# Patient Record
Sex: Female | Born: 1980
Health system: Southern US, Community
[De-identification: ages and names within clinical notes are randomized; demographics above are authoritative.]

## PROBLEM LIST (undated history)

## (undated) DIAGNOSIS — H539 Unspecified visual disturbance: Secondary | ICD-10-CM

## (undated) DIAGNOSIS — E282 Polycystic ovarian syndrome: Secondary | ICD-10-CM

## (undated) DIAGNOSIS — L709 Acne, unspecified: Secondary | ICD-10-CM

## (undated) DIAGNOSIS — R87619 Unspecified abnormal cytological findings in specimens from cervix uteri: Secondary | ICD-10-CM

## (undated) HISTORY — DX: Polycystic ovarian syndrome: E28.2

## (undated) HISTORY — DX: Unspecified visual disturbance: H53.9

## (undated) HISTORY — DX: Acne, unspecified: L70.9

## (undated) HISTORY — DX: Unspecified abnormal cytological findings in specimens from cervix uteri: R87.619

---

## 1998-02-17 HISTORY — PX: BREAST REDUCTION SURGERY: SHX8

## 2011-04-09 DIAGNOSIS — L709 Acne, unspecified: Secondary | ICD-10-CM | POA: Insufficient documentation

## 2013-07-04 DIAGNOSIS — K5909 Other constipation: Secondary | ICD-10-CM | POA: Insufficient documentation

## 2016-02-04 DIAGNOSIS — F419 Anxiety disorder, unspecified: Secondary | ICD-10-CM | POA: Insufficient documentation

## 2016-02-04 DIAGNOSIS — K219 Gastro-esophageal reflux disease without esophagitis: Secondary | ICD-10-CM | POA: Insufficient documentation

## 2016-05-22 DIAGNOSIS — R87619 Unspecified abnormal cytological findings in specimens from cervix uteri: Secondary | ICD-10-CM

## 2016-05-22 HISTORY — DX: Unspecified abnormal cytological findings in specimens from cervix uteri: R87.619

## 2017-05-11 ENCOUNTER — Emergency Department (INDEPENDENT_AMBULATORY_CARE_PROVIDER_SITE_OTHER)
Admission: EM | Admit: 2017-05-11 | Discharge: 2017-05-11 | Disposition: A | Discharge status: Home / Self Care | Payer: Managed Care, Other (non HMO) | Source: Home / Self Care | Attending: Emergency Medicine | Admitting: Emergency Medicine

## 2017-05-11 ENCOUNTER — Encounter: Payer: Self-pay | Admitting: Emergency Medicine

## 2017-05-11 DIAGNOSIS — R42 Dizziness and giddiness: Secondary | ICD-10-CM

## 2017-05-11 DIAGNOSIS — G44209 Tension-type headache, unspecified, not intractable: Secondary | ICD-10-CM | POA: Diagnosis not present

## 2017-05-11 MED ORDER — IBUPROFEN 200 MG PO TABS: ORAL_TABLET | ORAL | 0 refills | Status: DC

## 2017-05-11 NOTE — ED Provider Notes (Signed)
[facility]    CSN: [id number] Arrival date & time: 05/11/17  1920  [facility] 7:45 PM   History   Chief Complaint Chief Complaint  Patient presents with  . Dizziness    HPI Anita Thompson is a 37 y.o. female.   HPI Pt c/o dizziness and lightheadedness x1 year but states today it worsened. Vague sense of lightheadedness without vertigo, with mild tension/tightness of temporal muscles, associated with stressful feeling, intermittently for the past year.  She had returned home after a 12-hour day at work and was changing her clothes to exercise clothes, with the intention of exercising.  While at rest, the lightheadedness with mild temporal muscle tightness recurred, but was moderate in intensity without radiation.  She had not started exercising.  The above symptoms were nonexertional. When she experienced the above symptoms, she states she felt more anxious so she came right here to urgent care for evaluation. Associated symptoms: No focal weakness or numbness or syncope.  No chest pain or shortness of breath.  No nausea or vomiting or abdominal pain or diarrhea.  Denies GYN or GU symptoms. She admits that she is been under a lot of stress recently with a new job commuting from [place] to [place] every day.    History reviewed. No pertinent past medical history. I reviewed some past medical history on "care everywhere", including visit to GI 2018, with workup, and diagnosis gastroesophageal reflux and globus pharyngeus and chronic constipation.  Was placed on on Protonix 40 mg daily in 2018, and she reports the GE reflux has since resolved.  Still occasionally gets a globus sensation.  Has seen her GYN 2018 for abnormal uterine bleeding with negative workup.  Negative endometrial biopsy. There are no active problems to display for this patient.   History reviewed. No pertinent surgical history. She had breast reduction in  2001 OB History   None    LMP 04/16/2017, she denies chance of pregnancy  Home Medications    Prior to Admission medications   Medication Sig Start Date End Date Taking? Authorizing Provider  ibuprofen (ADVIL,MOTRIN) 200 MG tablet Take three tablets ( 600 milligrams total) every 6 with food as needed for pain. 05/11/17   Lajean ManesMassey, David, MD    Family History History reviewed. No pertinent family history. Positive for hypertension, prediabetes, stroke -mother Social History Social History   Tobacco Use  . Smoking status: Never Smoker  . Smokeless tobacco: Never Used  Substance Use Topics  . Alcohol use: Not on file  . Drug use: Not on file   Does not smoke or drink  Allergies   Patient has no allergy information on record. Per patient, no known drug allergies  Review of Systems Review of Systems  Constitutional: Positive for fatigue. Negative for chills, diaphoresis and fever.  HENT: Negative for congestion, facial swelling, rhinorrhea, sore throat, tinnitus and voice change.   Eyes: Negative for visual disturbance.  Respiratory: Negative for cough, chest tightness, shortness of breath, wheezing and stridor.   Cardiovascular: Negative for chest pain, palpitations and leg swelling.  Gastrointestinal: Negative for abdominal distention, abdominal pain, diarrhea, nausea and vomiting.  Genitourinary: Negative for difficulty urinating, dysuria, hematuria, pelvic pain and vaginal bleeding.  Skin: Negative for rash.  Neurological: Positive for dizziness and light-headedness. Negative for tremors, seizures, syncope, speech difficulty and numbness.  Hematological: Negative for adenopathy.  Psychiatric/Behavioral: Negative for confusion, dysphoric mood, hallucinations and suicidal ideas. The patient is nervous/anxious (Mild).   All  other systems reviewed and are negative.  See also history of present illness  Physical Exam Triage Vital Signs ED Triage Vitals  Enc Vitals Group      BP 05/11/17 1949 127/84     Pulse Rate 05/11/17 1949 66     Resp --      Temp 05/11/17 1949 98.6 F (37 C)     Temp Source 05/11/17 1949 Oral     SpO2 05/11/17 1949 100 %     Weight 05/11/17 1951 259 lb (117.5 kg)     Height --      Head Circumference --      Peak Flow --      Pain Score 05/11/17 1950 0     Pain Loc --      Pain Edu? --      Excl. in GC? --    No data found.  Updated Vital Signs BP 127/84 (BP Location: Right Arm)   Pulse 66   Temp 98.6 F (37 C) (Oral)   Wt 259 lb (117.5 kg)   LMP 04/15/2017   SpO2 100%   Visual Acuity Right Eye Distance:   Left Eye Distance:   Bilateral Distance:    Right Eye Near:   Left Eye Near:    Bilateral Near:     Physical Exam  Constitutional: She is oriented to person, place, and time. She appears well-developed and well-nourished. No distress.  Pleasant female, mildly anxious.  No acute distress  HENT:  Head: Normocephalic and atraumatic. Head is without raccoon's eyes and without Battle's sign.  Nose: Nose normal.  Mouth/Throat: Oropharynx is clear and moist.  Head: Mild by temporalis muscle tenderness. No bruits.  No other cranial tenderness or deformity.    Eyes: Pupils are equal, round, and reactive to light. Conjunctivae and EOM are normal. No scleral icterus.  Near vision, visual acuity grossly intact after testing each eye separately.  Neck: Normal range of motion. Neck supple. Normal carotid pulses and no JVD present. Carotid bruit is not present. No neck rigidity. No Brudzinski's sign and no Kernig's sign noted.  Cardiovascular: Normal rate, regular rhythm and normal heart sounds. Exam reveals no gallop and no friction rub.  No murmur heard. Pulmonary/Chest: Effort normal and breath sounds normal. No stridor. No respiratory distress. She has no wheezes. She has no rales.  Abdominal: Soft. She exhibits no distension. There is no tenderness.  Musculoskeletal: She exhibits no edema or tenderness.   Lymphadenopathy:    She has no cervical adenopathy.  Neurological: She is alert and oriented to person, place, and time. She displays normal reflexes. No cranial nerve deficit or sensory deficit. She exhibits normal muscle tone. Coordination normal.  Romberg negative  Skin: Skin is warm and dry. Capillary refill takes less than 2 seconds. No rash noted.  Psychiatric: She has a normal mood and affect. Her behavior is normal.  Mildly anxious  Vitals reviewed.    UC Treatments / Results  Labs (all labs ordered are listed, but only abnormal results are displayed) Labs Reviewed - No data to display  EKG None Radiology No results found.  Procedures Procedures (including critical care time)  Medications Ordered in UC Medications - No data to display   Initial Impression / Assessment and Plan / UC Course  I have reviewed the triage vital signs and the nursing notes.  Pertinent labs & imaging results that were available during my care of the patient were reviewed by me and considered in my medical  decision making (see chart for details).     Clinically, no evidence for acute neurologic or cardiorespiratory cause for her lightheadedness. Likely has muscle contraction headache and feeling of lightheadedness.  May be related to stress, new job, commuting to [place], 12-hour workday. We discussed quite at length.  Some counseling today. Over 40 minutes spent, greater than 50% of the time spent for counseling and coordination of care. Discussed the option of doing various tests, but she declined any testing today after discussion.  After discussion, she stated that she does not feel anxious now, and her bitemporal headache is significantly improved and she does not feel lightheaded now.  Advised that she may take ibuprofen up to 600 mg every 8 hours with food as needed headache.  Follow-up with your primary care doctor in 5-7 days if not improving, or emergency room/911 if symptoms  become worse, severe, or new symptoms. Precautions discussed. Red flags discussed. Questions invited and answered. Patient voiced understanding and agreement.  Final Clinical Impressions(s) / UC Diagnoses   Final diagnoses:  Lightheadedness  Acute non intractable tension-type headache    ED Discharge Orders        Ordered    ibuprofen (ADVIL,MOTRIN) 200 MG tablet     05/11/17 2051       Controlled Substance Prescriptions [place] Controlled Substance Registry consulted? Not Applicable   Lajean Manes, MD 05/13/17 [phone]

## 2017-05-11 NOTE — ED Triage Notes (Signed)
Pt c/o dizziness and lightheadedness x1 year but states today it worsened.

## 2017-10-13 ENCOUNTER — Encounter: Payer: Self-pay | Admitting: Osteopathic Medicine

## 2017-10-13 ENCOUNTER — Ambulatory Visit (INDEPENDENT_AMBULATORY_CARE_PROVIDER_SITE_OTHER): Payer: Managed Care, Other (non HMO) | Admitting: Osteopathic Medicine

## 2017-10-13 VITALS — BP 104/65 | HR 72 | Temp 98.0°F | Ht 68.0 in | Wt 246.7 lb

## 2017-10-13 DIAGNOSIS — Z833 Family history of diabetes mellitus: Secondary | ICD-10-CM | POA: Insufficient documentation

## 2017-10-13 DIAGNOSIS — Z823 Family history of stroke: Secondary | ICD-10-CM | POA: Diagnosis not present

## 2017-10-13 DIAGNOSIS — R42 Dizziness and giddiness: Secondary | ICD-10-CM

## 2017-10-13 DIAGNOSIS — Z87898 Personal history of other specified conditions: Secondary | ICD-10-CM | POA: Diagnosis not present

## 2017-10-13 DIAGNOSIS — Z8742 Personal history of other diseases of the female genital tract: Secondary | ICD-10-CM | POA: Insufficient documentation

## 2017-10-13 HISTORY — DX: Family history of diabetes mellitus: Z83.3

## 2017-10-13 HISTORY — DX: Family history of stroke: Z82.3

## 2017-10-13 MED ORDER — TRETINOIN MICROSPHERE 0.1 % EX GEL: CUTANEOUS | 2 refills | Status: DC

## 2017-10-13 NOTE — Progress Notes (Signed)
HPI: Anita Thompson is a 37 y.o. female who  has no past medical history on file.  she presents to [facility] today, 10/13/17,  for chief complaint of: New to establish care Requests routine annual physical   Reports history "dizziness" on and off >1 year, but not really true dizzzy/vertigo - difficult to describe. Urgent care visit 04/2017 notes reviewed,. Feels like "somehting in my brain is falling off a shelf." Happens maybe once per week, lasts a few moments then resolves. No lightheadedness or spinning sensation. Hx vision problems in L eye. No headaches. No discernable triggers.   Reports history back pain, not too bothersome right now.   Reports history PCOS, no OCP at this time.   Acne: on Tretinoin gel, not sexually active, requests refill   Attention problems: focusing difficulty after a couple hours at work     Past medical, surgical, social and family history reviewed:  Patient Active Problem List   Diagnosis Date Noted  . History of abnormal cervical Pap smear 10/13/2017  . Family history of diabetes mellitus in mother 10/13/2017  . Family history of stroke or transient ischemic attack in mother 10/13/2017  . Abnormal Pap smear of cervix 05/22/2016  . Anxiety 02/04/2016  . Gastroesophageal reflux disease without esophagitis 02/04/2016  . Chronic constipation 07/04/2013  . Acne 04/09/2011   Past Surgical History:  Procedure Laterality Date  . BREAST REDUCTION SURGERY  2000   Social History   Tobacco Use  . Smoking status: Never Smoker  . Smokeless tobacco: Never Used  Substance Use Topics  . Alcohol use: Not Currently   Family History  Problem Relation Age of Onset  . High blood pressure Mother   . Diabetes Mother   . Stroke Mother   . High Cholesterol Mother      Current medication list and allergy/intolerance information reviewed:    Current Outpatient Medications  Medication Sig Dispense Refill  . tretinoin  microspheres (RETIN-A MICRO) 0.1 % gel APPLY PEA SIZED AMOUNT TO THE ENTIRE FACE EACH NIGHT AT BEDTIME    . ibuprofen (ADVIL,MOTRIN) 200 MG tablet Take three tablets ( 600 milligrams total) every 6 with food as needed for pain. (Patient not taking: Reported on 10/13/2017) 30 tablet 0   No current facility-administered medications for this visit.     No Known Allergies    Review of Systems:  Constitutional:  No  fever, no chills, No recent illness, No unintentional weight changes. No significant fatigue.   HEENT: No  headache, no vision change, no hearing change, No sore throat, No  sinus pressure  Cardiac: No  chest pain, No  pressure, No palpitations, No  Orthopnea  Respiratory:  No  shortness of breath. No  Cough  Gastrointestinal: No  abdominal pain, No  nausea, No  vomiting,  No  blood in stool, No  diarrhea, No  constipation   Musculoskeletal: No new myalgia/arthralgia  Skin: No  Rash, No other wounds/concerning lesions  Genitourinary: No  incontinence, No  abnormal genital bleeding, No abnormal genital discharge  Hem/Onc: No  easy bruising/bleeding, No  abnormal lymph node  Endocrine: No cold intolerance,  No heat intolerance. No polyuria/polydipsia/polyphagia   Neurologic: No  weakness, +dizziness, No  slurred speech/focal weakness/facial droop  Psychiatric: No  concerns with depression, No  concerns with anxiety, No sleep problems, No mood problems  Exam:  BP 104/65 (BP Location: Left Arm, Patient Position: Sitting, Cuff Size: Large)   Pulse 72   Temp  98 F (36.7 C) (Oral)   Wt 246 lb 11.2 oz (111.9 kg)   LMP 10/01/2017   Constitutional: VS see above. General Appearance: alert, well-developed, well-nourished, NAD  Eyes: Normal lids and conjunctive, non-icteric sclera  Ears, Nose, Mouth, Throat: MMM, Normal external inspection ears/nares/mouth/lips/gums.   Neck: No masses, trachea midline. No thyroid enlargement. No tenderness/mass appreciated. No  lymphadenopathy  Respiratory: Normal respiratory effort. no wheeze, no rhonchi, no rales  Cardiovascular: S1/S2 normal, no murmur, no rub/gallop auscultated. RRR. No lower extremity edema.   Gastrointestinal: Nontender, no masses. No hepatomegaly, no splenomegaly. No hernia appreciated. Bowel sounds normal. Rectal exam deferred.   Musculoskeletal: Gait normal. No clubbing/cyanosis of digits.   Neurological: Normal balance/coordination. No tremor. No cranial nerve deficit on limited exam. Motor and sensation intact and symmetric all extremities and face. Cerebellar reflexes intact. PERRL, EOMI, no nystagmus, dix-hallpike negative bilaterally  Skin: warm, dry, intact. No rash/ulcer. No concerning nevi or subq nodules on limited exam.    Psychiatric: Normal judgment/insight. Normal mood and affect. Oriented x3.      ASSESSMENT/PLAN: The primary encounter diagnosis was Episode of dizziness. Diagnoses of History of abnormal cervical Pap smear, Family history of diabetes mellitus in mother, and Family history of stroke or transient ischemic attack in mother were also pertinent to this visit.  Orders Placed This Encounter  Procedures  . CBC  . COMPLETE METABOLIC PANEL WITH GFR  . Lipid panel  . TSH  . VITAMIN D 25 Hydroxy (Vit-D Deficiency, Fractures)  . Ambulatory referral to Neurology    Referral Priority:   Routine    Referral Type:   Consultation    Referral Reason:   Specialty Services Required    Requested Specialty:   Neurology    Number of Visits Requested:   1   Meds ordered this encounter  Medications  . tretinoin microspheres (RETIN-A MICRO) 0.1 % gel    Sig: APPLY PEA SIZED AMOUNT TO THE ENTIRE FACE EACH NIGHT AT BEDTIME    Dispense:  45 g    Refill:  2      Patient Instructions  Plan:   Will get lab work   Will refer neurology to discuss these episodes  They may recommend MRI or other testing    Can consider non-stimulant medication (Wellbutrin) to help  with attention   Refill Tretinoin      Visit summary with medication list and pertinent instructions was printed for patient to review. All questions at time of visit were answered - patient instructed to contact office with any additional concerns. ER/RTC precautions were reviewed with the patient.   Follow-up plan: Return for annual checkup next few months, otherwise as needed / based on results of labs and neurology advice .    Please note: voice recognition software was used to produce this document, and typos may escape review. Please contact Dr. Lyn Hollingshead for any needed clarifications.

## 2017-10-13 NOTE — Patient Instructions (Addendum)
Plan:   Will get lab work   Will refer neurology to discuss these episodes  They may recommend MRI or other testing    Can consider non-stimulant medication (Wellbutrin) to help with attention   Refill Tretinoin

## 2017-10-14 LAB — CBC
HCT: 36.8 % (ref 35.0–45.0)
Hemoglobin: 11.8 g/dL (ref 11.7–15.5)
MCH: 25 pg — AB (ref 27.0–33.0)
MCHC: 32.1 g/dL (ref 32.0–36.0)
MCV: 78 fL — ABNORMAL LOW (ref 80.0–100.0)
MPV: 10.5 fL (ref 7.5–12.5)
PLATELETS: 254 10*3/uL (ref 140–400)
RBC: 4.72 10*6/uL (ref 3.80–5.10)
RDW: 15.7 % — ABNORMAL HIGH (ref 11.0–15.0)
WBC: 5.5 10*3/uL (ref 3.8–10.8)

## 2017-10-14 LAB — LIPID PANEL
CHOLESTEROL: 164 mg/dL (ref <200)
HDL: 49 mg/dL — ABNORMAL LOW (ref >50)
LDL CHOLESTEROL (CALC): 101 mg/dL — AB
Non-HDL Cholesterol (Calc): 115 mg/dL (calc) (ref <130)
TRIGLYCERIDES: 57 mg/dL (ref <150)
Total CHOL/HDL Ratio: 3.3 (calc) (ref <5.0)

## 2017-10-14 LAB — COMPLETE METABOLIC PANEL WITH GFR
AG Ratio: 1.4 (calc) (ref 1.0–2.5)
ALT: 12 U/L (ref 6–29)
AST: 20 U/L (ref 10–30)
Albumin: 4.5 g/dL (ref 3.6–5.1)
Alkaline phosphatase (APISO): 48 U/L (ref 33–115)
BUN: 15 mg/dL (ref 7–25)
CO2: 25 mmol/L (ref 20–32)
Calcium: 10 mg/dL (ref 8.6–10.2)
Chloride: 106 mmol/L (ref 98–110)
Creat: 0.9 mg/dL (ref 0.50–1.10)
GFR, EST AFRICAN AMERICAN: 94 mL/min/{1.73_m2} (ref >=60)
GFR, Est Non African American: 81 mL/min/{1.73_m2} (ref >=60)
GLUCOSE: 98 mg/dL (ref 65–99)
Globulin: 3.3 g/dL (calc) (ref 1.9–3.7)
Potassium: 4 mmol/L (ref 3.5–5.3)
Sodium: 140 mmol/L (ref 135–146)
TOTAL PROTEIN: 7.8 g/dL (ref 6.1–8.1)
Total Bilirubin: 0.3 mg/dL (ref 0.2–1.2)

## 2017-10-14 LAB — TSH: TSH: 0.59 mIU/L

## 2017-10-14 LAB — VITAMIN D 25 HYDROXY (VIT D DEFICIENCY, FRACTURES): Vit D, 25-Hydroxy: 13 ng/mL — ABNORMAL LOW (ref 30–100)

## 2017-10-16 ENCOUNTER — Other Ambulatory Visit: Payer: Self-pay | Admitting: Osteopathic Medicine

## 2017-10-16 DIAGNOSIS — Z833 Family history of diabetes mellitus: Secondary | ICD-10-CM

## 2017-10-16 DIAGNOSIS — R42 Dizziness and giddiness: Secondary | ICD-10-CM

## 2017-10-16 DIAGNOSIS — Z823 Family history of stroke: Secondary | ICD-10-CM

## 2017-10-16 DIAGNOSIS — Z8742 Personal history of other diseases of the female genital tract: Secondary | ICD-10-CM

## 2017-10-16 MED ORDER — VITAMIN D (ERGOCALCIFEROL) 1.25 MG (50000 UNIT) PO CAPS: 50000.0000 [IU] | ORAL_CAPSULE | ORAL | 0 refills | Status: DC

## 2017-10-16 NOTE — Telephone Encounter (Signed)
Note from pharmacy:  "Product Backordered/Unavailable:MICRO GEL ON BACK ORDER, PLEASE SEND ALTERNATIVE. THANK YOU."   Please advise

## 2017-10-16 NOTE — Telephone Encounter (Signed)
Patient specifically requested the gel, would call and let her know that it is on back order at her preferred pharmacy.  We can send it elsewhere if she would like.  (Would like to discuss with her though before I send alternative since she specifically asked for the gel.)

## 2017-10-16 NOTE — Addendum Note (Signed)
Addended by: Deirdre PippinsALEXANDER, Rewa Weissberg M on: 10/16/2017 02:26 PM   Modules accepted: Orders

## 2017-10-16 NOTE — Telephone Encounter (Signed)
Left a detailed vm msg for pt regarding note from pharmacy/provider. Call back information provided.

## 2017-10-23 NOTE — Telephone Encounter (Signed)
As per pharmacy - tretinoin gel on backorder. Pt requesting a rx for regular retin-a. Thanks.

## 2017-10-26 NOTE — Telephone Encounter (Signed)
Ok meds sent.

## 2017-10-26 NOTE — Telephone Encounter (Signed)
Left vm msg for pt re: replacement med sent into pharmacy. Call back information provided.

## 2018-01-05 ENCOUNTER — Other Ambulatory Visit: Payer: Self-pay | Admitting: Osteopathic Medicine

## 2018-01-07 ENCOUNTER — Encounter: Payer: Self-pay | Admitting: Osteopathic Medicine

## 2018-01-07 NOTE — Telephone Encounter (Signed)
Pt advised and scheduled for next week.  

## 2018-01-07 NOTE — Telephone Encounter (Signed)
Needs appointment Can do basic vision screening and eye exam here to rule out serious problem I could not really attest to her eye issues without doing a personal exam

## 2018-01-07 NOTE — Telephone Encounter (Signed)
Called Pt per request. She is requesting a letter from her PCP  Pt is scheduled with [facility] in Feb for evaluation. She is having issues with night driving, especially the lights associated. She is also having difficulty looking at the computer monitor at work for long periods. Employer states if PCP writes letter, she will be able to work from home until she can get in with the specialist.   Work letter attention: Towanda Octaveony Martin, [employer].   Will route to PCP for review. Pt able to come in for OV if needed.

## 2018-01-12 ENCOUNTER — Ambulatory Visit (INDEPENDENT_AMBULATORY_CARE_PROVIDER_SITE_OTHER): Payer: Managed Care, Other (non HMO) | Admitting: Osteopathic Medicine

## 2018-01-12 ENCOUNTER — Encounter: Payer: Self-pay | Admitting: Osteopathic Medicine

## 2018-01-12 VITALS — BP 112/66 | HR 82 | Temp 97.9°F | Wt 246.2 lb

## 2018-01-12 DIAGNOSIS — H539 Unspecified visual disturbance: Secondary | ICD-10-CM

## 2018-01-12 HISTORY — DX: Unspecified visual disturbance: H53.9

## 2018-01-12 NOTE — Progress Notes (Signed)
HPI: Anita Thompson is a 37 y.o. female who  has a past medical history of Abnormal Pap smear of cervix (05/22/2016), Acne, PCOS (polycystic ovarian syndrome), and Vision abnormalities (01/12/2018).  she presents to [facility] today, 01/12/18,  for chief complaint of:  Vision/eye issue   . Location: L eye . Quality: issues with night driving and looking at computer screen for prolonged periods of time - causes eye strain and headache . Severity: . Duration: worse since she's been having to commute more and drive at night  . Context: L eye has history of vision problem, pt not sure exact dx but has been a problem since childhood, she at one point was wearing a ptach over the R eye to compensate but the L eye vision was so bad she was falling and running into things so this therapy was stopped.  . Modifying factors: working form home helps - avoids driving, she can take frequent breaks from screen time  . Assoc signs/symptoms: occasional headache after driving at night, looking at screens too long.    OD: 20/25 OS: 20/200 (yes, two hundred) OU: 20/25   At today's visit... Past medical history, surgical history, and family history reviewed and updated as needed.  Current medication list and allergy/intolerance information reviewed and updated as needed. (See remainder of HPI, ROS, Phys Exam below)           ASSESSMENT/PLAN: The encounter diagnosis was Vision abnormalities.   Limited funduscopic exam but chronic issue exacerbated by recent job changes seems more the issue than worsening of condition itself  Keep appt w/ [facility]!  See letters printed for patient - have employer contact us if additional info needed       Follow-up plan: Return if symptoms worsen or change prior to appointment with eye doctor  .                             ############################################ ############################################ ############################################ ############################################    Current Meds  Medication Sig  . ibuprofen (ADVIL,MOTRIN) 200 MG tablet Take three tablets ( 600 milligrams total) every 6 with food as needed for pain.  Marland Kitchen tretinoin (RETIN-A) 0.1 % cream Apply topically at bedtime.  . Vitamin D, Ergocalciferol, (DRISDOL) 50000 units CAPS capsule Take 1 capsule (50,000 Units total) by mouth every 7 (seven) days. Take for 12 total doses(weeks)    No Known Allergies     Review of Systems:  Constitutional: No recent illness  HEENT: see HPI  Cardiac: No  chest pain, No  pressure, No palpitations  Respiratory:  No  shortness of breath. No  Cough  Gastrointestinal: No  abdominal pain, no change on bowel habits  Musculoskeletal: No new myalgia/arthralgia  Neurologic: No  weakness, No  Dizziness  Psychiatric: No  concerns with depression, No  concerns with anxiety  Exam:  BP 112/66 (BP Location: Left Arm, Patient Position: Sitting, Cuff Size: Normal)   Pulse 82   Temp 97.9 F (36.6 C) (Oral)   Wt 246 lb 3.2 oz (111.7 kg)   BMI 37.43 kg/m   Constitutional: VS see above. General Appearance: alert, well-developed, well-nourished, NAD  Eyes: Normal lids and conjunctive, non-icteric sclera, EOMI, PERRL. Limited funduscopic exam R eye appears normal, L eye appears darker ?catraract, some vasculature isible at fundus, pt was having a difficult time w/ light and pupil constriction limited exam   Ears, Nose, Mouth, Throat: MMM, Normal external  inspection ears/nares/mouth/lips/gums.  Neck: No masses, trachea midline.   Respiratory: Normal respiratory effort.   Musculoskeletal: Gait normal. Symmetric and independent movement of all extremities  Neurological: Normal balance/coordination. No tremor.  Skin:  warm, dry, intact.   Psychiatric: Normal judgment/insight. Normal mood and affect. Oriented x3.       Visit summary with medication list and pertinent instructions was printed for patient to review, patient was advised to alert us if any updates are needed. All questions at time of visit were answered - patient instructed to contact office with any additional concerns. ER/RTC precautions were reviewed with the patient and understanding verbalized.   Note: Total time spent 25 minutes, greater than 50% of the visit was spent face-to-face counseling and coordinating care for the following: The encounter diagnosis was Vision abnormalities..  Please note: voice recognition software was used to produce this document, and typos may escape review. Please contact Dr. Lyn HollingsheadAlexander for any needed clarifications.    Follow up plan: Return if symptoms worsen or change prior to appointment with eye doctor .

## 2018-03-10 ENCOUNTER — Telehealth: Payer: Self-pay

## 2018-03-10 DIAGNOSIS — R42 Dizziness and giddiness: Secondary | ICD-10-CM

## 2018-03-10 NOTE — Telephone Encounter (Signed)
Pt called requesting a new referral for neurologist. As per pt, she did not f/u as required when last referral was initiated. When she called [facility] to make an appt, she was informed that referral had expired. Pls advise, thanks.

## 2018-03-11 NOTE — Telephone Encounter (Signed)
Referral sent 

## 2018-03-12 ENCOUNTER — Encounter: Payer: Self-pay | Admitting: Neurology

## 2018-03-25 DIAGNOSIS — H5212 Myopia, left eye: Secondary | ICD-10-CM | POA: Diagnosis not present

## 2018-03-25 DIAGNOSIS — H5201 Hypermetropia, right eye: Secondary | ICD-10-CM | POA: Diagnosis not present

## 2018-03-25 DIAGNOSIS — H53022 Refractive amblyopia, left eye: Secondary | ICD-10-CM | POA: Diagnosis not present

## 2018-03-25 DIAGNOSIS — H52222 Regular astigmatism, left eye: Secondary | ICD-10-CM | POA: Diagnosis not present

## 2018-05-04 ENCOUNTER — Ambulatory Visit: Payer: Managed Care, Other (non HMO) | Admitting: Neurology

## 2018-05-18 DIAGNOSIS — F458 Other somatoform disorders: Secondary | ICD-10-CM | POA: Diagnosis not present

## 2018-05-18 DIAGNOSIS — R1310 Dysphagia, unspecified: Secondary | ICD-10-CM | POA: Diagnosis not present

## 2018-05-18 DIAGNOSIS — R079 Chest pain, unspecified: Secondary | ICD-10-CM | POA: Diagnosis not present

## 2018-05-18 DIAGNOSIS — R0789 Other chest pain: Secondary | ICD-10-CM | POA: Diagnosis not present

## 2018-05-18 DIAGNOSIS — Z79899 Other long term (current) drug therapy: Secondary | ICD-10-CM | POA: Diagnosis not present

## 2018-05-18 DIAGNOSIS — K219 Gastro-esophageal reflux disease without esophagitis: Secondary | ICD-10-CM | POA: Diagnosis not present

## 2018-06-15 ENCOUNTER — Encounter: Payer: Self-pay | Admitting: Neurology

## 2018-06-24 ENCOUNTER — Ambulatory Visit: Payer: Self-pay | Admitting: Neurology

## 2018-09-13 ENCOUNTER — Ambulatory Visit: Payer: Self-pay | Admitting: Neurology

## 2018-12-20 ENCOUNTER — Encounter: Payer: Self-pay | Admitting: Osteopathic Medicine

## 2018-12-22 DIAGNOSIS — Z6839 Body mass index (BMI) 39.0-39.9, adult: Secondary | ICD-10-CM | POA: Diagnosis not present

## 2018-12-22 DIAGNOSIS — Z1231 Encounter for screening mammogram for malignant neoplasm of breast: Secondary | ICD-10-CM | POA: Diagnosis not present

## 2018-12-22 DIAGNOSIS — Z1151 Encounter for screening for human papillomavirus (HPV): Secondary | ICD-10-CM | POA: Diagnosis not present

## 2018-12-22 DIAGNOSIS — Z0000 Encounter for general adult medical examination without abnormal findings: Secondary | ICD-10-CM | POA: Diagnosis not present

## 2018-12-22 DIAGNOSIS — Z01419 Encounter for gynecological examination (general) (routine) without abnormal findings: Secondary | ICD-10-CM | POA: Diagnosis not present

## 2018-12-23 DIAGNOSIS — Z1151 Encounter for screening for human papillomavirus (HPV): Secondary | ICD-10-CM | POA: Diagnosis not present

## 2018-12-23 DIAGNOSIS — Z01419 Encounter for gynecological examination (general) (routine) without abnormal findings: Secondary | ICD-10-CM | POA: Diagnosis not present

## 2019-03-08 ENCOUNTER — Ambulatory Visit (INDEPENDENT_AMBULATORY_CARE_PROVIDER_SITE_OTHER): Payer: BC Managed Care – PPO | Admitting: Osteopathic Medicine

## 2019-03-08 ENCOUNTER — Other Ambulatory Visit: Payer: Self-pay

## 2019-03-08 ENCOUNTER — Encounter: Payer: Self-pay | Admitting: Osteopathic Medicine

## 2019-03-08 VITALS — BP 114/84 | HR 76 | Temp 97.6°F | Wt 264.1 lb

## 2019-03-08 DIAGNOSIS — E559 Vitamin D deficiency, unspecified: Secondary | ICD-10-CM

## 2019-03-08 DIAGNOSIS — Z0000 Encounter for general adult medical examination without abnormal findings: Secondary | ICD-10-CM | POA: Diagnosis not present

## 2019-03-08 LAB — LIPID PANEL
Cholesterol: 172 mg/dL (ref <200)
HDL: 47 mg/dL — ABNORMAL LOW (ref >=50)
LDL Cholesterol (Calc): 110 mg/dL (calc) — ABNORMAL HIGH
Non-HDL Cholesterol (Calc): 125 mg/dL (calc) (ref <130)
Total CHOL/HDL Ratio: 3.7 (calc) (ref <5.0)
Triglycerides: 61 mg/dL (ref <150)

## 2019-03-08 LAB — COMPLETE METABOLIC PANEL WITH GFR
AG Ratio: 1.4 (calc) (ref 1.0–2.5)
ALT: 17 U/L (ref 6–29)
AST: 22 U/L (ref 10–30)
Albumin: 4.4 g/dL (ref 3.6–5.1)
Alkaline phosphatase (APISO): 49 U/L (ref 31–125)
BUN: 8 mg/dL (ref 7–25)
CO2: 26 mmol/L (ref 20–32)
Calcium: 9.6 mg/dL (ref 8.6–10.2)
Chloride: 104 mmol/L (ref 98–110)
Creat: 0.78 mg/dL (ref 0.50–1.10)
GFR, Est African American: 110 mL/min/{1.73_m2} (ref >=60)
GFR, Est Non African American: 95 mL/min/{1.73_m2} (ref >=60)
Globulin: 3.1 g/dL (calc) (ref 1.9–3.7)
Glucose, Bld: 89 mg/dL (ref 65–99)
Potassium: 3.9 mmol/L (ref 3.5–5.3)
Sodium: 139 mmol/L (ref 135–146)
Total Bilirubin: 0.5 mg/dL (ref 0.2–1.2)
Total Protein: 7.5 g/dL (ref 6.1–8.1)

## 2019-03-08 LAB — CBC
HCT: 39.8 % (ref 35.0–45.0)
Hemoglobin: 12.8 g/dL (ref 11.7–15.5)
MCH: 26 pg — ABNORMAL LOW (ref 27.0–33.0)
MCHC: 32.2 g/dL (ref 32.0–36.0)
MCV: 80.9 fL (ref 80.0–100.0)
MPV: 10.7 fL (ref 7.5–12.5)
Platelets: 197 10*3/uL (ref 140–400)
RBC: 4.92 10*6/uL (ref 3.80–5.10)
RDW: 14.7 % (ref 11.0–15.0)
WBC: 4.4 10*3/uL (ref 3.8–10.8)

## 2019-03-08 LAB — VITAMIN D 25 HYDROXY (VIT D DEFICIENCY, FRACTURES): Vit D, 25-Hydroxy: 23 ng/mL — ABNORMAL LOW (ref 30–100)

## 2019-03-08 NOTE — Progress Notes (Signed)
HPI: Anita Thompson is a 39 y.o. female who  has a past medical history of Abnormal Pap smear of cervix (05/22/2016), Acne, PCOS (polycystic ovarian syndrome), and Vision abnormalities (01/12/2018).  she presents to [facility] today, 03/08/19,  for chief complaint of: Annual physical.  Patient here for annual physical, no significant complaints today otherwise.   Past medical, surgical, social and family history reviewed:  Patient Active Problem List   Diagnosis Date Noted  . Vision abnormalities 01/12/2018  . Family history of diabetes mellitus in mother 10/13/2017  . Family history of stroke or transient ischemic attack in mother 10/13/2017  . Abnormal Pap smear of cervix 05/22/2016  . Anxiety 02/04/2016  . Gastroesophageal reflux disease without esophagitis 02/04/2016  . Chronic constipation 07/04/2013  . Acne 04/09/2011    Past Surgical History:  Procedure Laterality Date  . BREAST REDUCTION SURGERY  2000    Social History   Tobacco Use  . Smoking status: Never Smoker  . Smokeless tobacco: Never Used  Substance Use Topics  . Alcohol use: Not Currently    Family History  Problem Relation Age of Onset  . High blood pressure Mother   . Diabetes Mother   . Stroke Mother   . High Cholesterol Mother      Current medication list and allergy/intolerance information reviewed:    Current Outpatient Medications  Medication Sig Dispense Refill  . ibuprofen (ADVIL,MOTRIN) 200 MG tablet Take three tablets ( 600 milligrams total) every 6 with food as needed for pain. 30 tablet 0  . tretinoin (RETIN-A) 0.1 % cream Apply topically at bedtime. 45 g 2  . Vitamin D, Ergocalciferol, (DRISDOL) 1.25 MG (50000 UNIT) CAPS capsule Take 1 capsule (50,000 Units total) by mouth every 7 (seven) days. Take for 12 total doses(weeks) then transition to OTC 1000 to 2000 units daily 12 capsule 0   No current facility-administered medications for this  visit.    No Known Allergies      Exam:  BP 114/84 (BP Location: Left Arm, Patient Position: Sitting, Cuff Size: Large)   Pulse 76   Temp 97.6 F (36.4 C) (Oral)   Wt 264 lb 1.9 oz (119.8 kg)   BMI 40.16 kg/m   Constitutional: VS see above. General Appearance: alert, well-developed, well-nourished, NAD  Eyes: Normal lids and conjunctive, non-icteric sclera  Ears, Nose, Mouth, Throat:TM normal bilaterally  Neck: No masses, trachea midline. No thyroid enlargement. No tenderness/mass appreciated. No lymphadenopathy  Respiratory: Normal respiratory effort. no wheeze, no rhonchi, no rales  Cardiovascular: S1/S2 normal, no murmur, no rub/gallop auscultated. RRR. No lower extremity edema.   Gastrointestinal: Nontender, no masses. No hepatomegaly, no splenomegaly. No hernia appreciated. Bowel sounds normal. Rectal exam deferred.   Musculoskeletal: Gait normal. No clubbing/cyanosis of digits.   Neurological: Normal balance/coordination. No tremor.   Skin: warm, dry, intact. No rash/ulcer. No concerning nevi or subq nodules on limited exam.    Psychiatric: Normal judgment/insight. Normal mood and affect. Oriented x3.    Results for orders placed or performed in visit on 03/08/19 (from the past 72 hour(s))  CBC     Status: Abnormal   Collection Time: 03/08/19 11:09 AM  Result Value Ref Range   WBC 4.4 3.8 - 10.8 Thousand/uL   RBC 4.92 3.80 - 5.10 Million/uL   Hemoglobin 12.8 11.7 - 15.5 g/dL   HCT 78.6 76.7 - 20.9 %   MCV 80.9 80.0 - 100.0 fL   MCH 26.0 (L) 27.0 - 33.0  pg   MCHC 32.2 32.0 - 36.0 g/dL   RDW 95.6 21.3 - 08.6 %   Platelets 197 140 - 400 Thousand/uL   MPV 10.7 7.5 - 12.5 fL  COMPLETE METABOLIC PANEL WITH GFR     Status: None   Collection Time: 03/08/19 11:09 AM  Result Value Ref Range   Glucose, Bld 89 65 - 99 mg/dL    Comment: .            Fasting reference interval .    BUN 8 7 - 25 mg/dL   Creat 5.78 4.69 - 6.29 mg/dL   GFR, Est Non African  American 95 > OR = 60 mL/min/1.31m2   GFR, Est African American 110 > OR = 60 mL/min/1.59m2   BUN/Creatinine Ratio NOT APPLICABLE 6 - 22 (calc)   Sodium 139 135 - 146 mmol/L   Potassium 3.9 3.5 - 5.3 mmol/L   Chloride 104 98 - 110 mmol/L   CO2 26 20 - 32 mmol/L   Calcium 9.6 8.6 - 10.2 mg/dL   Total Protein 7.5 6.1 - 8.1 g/dL   Albumin 4.4 3.6 - 5.1 g/dL   Globulin 3.1 1.9 - 3.7 g/dL (calc)   AG Ratio 1.4 1.0 - 2.5 (calc)   Total Bilirubin 0.5 0.2 - 1.2 mg/dL   Alkaline phosphatase (APISO) 49 31 - 125 U/L   AST 22 10 - 30 U/L   ALT 17 6 - 29 U/L  Lipid panel     Status: Abnormal   Collection Time: 03/08/19 11:09 AM  Result Value Ref Range   Cholesterol 172 <200 mg/dL   HDL 47 (L) > OR = 50 mg/dL   Triglycerides 61 <528 mg/dL   LDL Cholesterol (Calc) 110 (H) mg/dL (calc)    Comment: Reference range: <100 . Desirable range <100 mg/dL for primary prevention;   <70 mg/dL for patients with CHD or diabetic patients  with > or = 2 CHD risk factors. Marland Kitchen LDL-C is now calculated using the Martin-Hopkins  calculation, which is a validated novel method providing  better accuracy than the Friedewald equation in the  estimation of LDL-C.  Horald Pollen et al. Lenox Ahr. 4132;440(10): 2061-2068  (http://education.[facility].com/faq/FAQ164)    Total CHOL/HDL Ratio 3.7 <5.0 (calc)   Non-HDL Cholesterol (Calc) 125 <130 mg/dL (calc)    Comment: For patients with diabetes plus 1 major ASCVD risk  factor, treating to a non-HDL-C goal of <100 mg/dL  (LDL-C of <27 mg/dL) is considered a therapeutic  option.   VITAMIN D 25 Hydroxy (Vit-D Deficiency, Fractures)     Status: Abnormal   Collection Time: 03/08/19 11:09 AM  Result Value Ref Range   Vit D, 25-Hydroxy 23 (L) 30 - 100 ng/mL    Comment: Vitamin D Status         25-OH Vitamin D: . Deficiency:                    <20 ng/mL Insufficiency:             20 - 29 ng/mL Optimal:                 > or = 30 ng/mL . For 25-OH Vitamin D testing on  patients on  D2-supplementation and patients for whom quantitation  of D2 and D3 fractions is required, the [facility]ureD(TM) 25-OH VIT D, (D2,D3), LC/MS/MS is recommended: order  code 25366 (patients >78yrs). See Note 1 . Note 1 . For additional information, please refer to  http://education.[facility].com/faq/FAQ199  (This link is being provided for informational/ educational purposes only.)     No results found.   ASSESSMENT/PLAN: The primary encounter diagnosis was Annual physical exam. A diagnosis of Vitamin D deficiency was also pertinent to this visit.   Orders Placed This Encounter  Procedures  . CBC  . COMPLETE METABOLIC PANEL WITH GFR  . Lipid panel  . VITAMIN D 25 Hydroxy (Vit-D Deficiency, Fractures)    No orders of the defined types were placed in this encounter.   Patient Instructions  General Preventive Care  Most recent routine screening lipids/other labs: ordered today   Everyone should have blood pressure checked once per year.   Tobacco: don't!  Alcohol: responsible moderation is ok for most adults - if you have concerns about your alcohol intake, please talk to me!   Exercise: as tolerated to reduce risk of cardiovascular disease and diabetes. Strength training will also prevent osteoporosis.   Mental health: if need for mental health care (medicines, counseling, other), or concerns about moods, please let me know!   Sexual health: if need for STD testing, or if concerns with libido/pain problems, please let me know! If you need to discuss your birth control options, please let me know!   Advanced Directive: Living Will and/or Healthcare Power of Attorney recommended for all adults, regardless of age or health.  Vaccines  Flu vaccine: recommended for almost everyone, every fall.   Shingles vaccine: Shingrix recommended after age 17.   Pneumonia vaccines: Prevnar and Pneumovax recommended after age 74.  Tetanus booster: Tdap  recommended every 10 years. Due 2027.  Cancer screenings   Colon cancer screening: recommended for everyone at age 66  Breast cancer screening: mammogram recommended at age 21 every other year at least, and annually after age 39.   Cervical cancer screening: Pap per OBGYN  Lung cancer screening: not needed for non-smokers. Infection screenings . HIV: recommended screening at least once age 5-65, more often as needed. . Gonorrhea/Chlamydia: screening as needed. . Hepatitis C: recommended for anyone born 19-1967 -not needed!  . TB: certain at-risk populations, or depending on work requirements and/or travel history Other . Bone Density Test: recommended for women at age 71.        Visit summary with medication list and pertinent instructions was printed for patient to review. All questions at time of visit were answered - patient instructed to contact office with any additional concerns or updates. ER/RTC precautions were reviewed with the patient.    Please note: voice recognition software was used to produce this document, and typos may escape review. Please contact Dr. Sheppard Coil for any needed clarifications.     Follow-up plan: Return in about 1 year (around 03/07/2020) for [place] (call week prior to visit for lab orders).

## 2019-03-08 NOTE — Patient Instructions (Addendum)
General Preventive Care  Most recent routine screening lipids/other labs: ordered today   Everyone should have blood pressure checked once per year.   Tobacco: don't!  Alcohol: responsible moderation is ok for most adults - if you have concerns about your alcohol intake, please talk to me!   Exercise: as tolerated to reduce risk of cardiovascular disease and diabetes. Strength training will also prevent osteoporosis.   Mental health: if need for mental health care (medicines, counseling, other), or concerns about moods, please let me know!   Sexual health: if need for STD testing, or if concerns with libido/pain problems, please let me know! If you need to discuss your birth control options, please let me know!   Advanced Directive: Living Will and/or Healthcare Power of Attorney recommended for all adults, regardless of age or health.  Vaccines  Flu vaccine: recommended for almost everyone, every fall.   Shingles vaccine: Shingrix recommended after age 37.   Pneumonia vaccines: Prevnar and Pneumovax recommended after age 94.  Tetanus booster: Tdap recommended every 10 years. Due 2027.  Cancer screenings   Colon cancer screening: recommended for everyone at age 15  Breast cancer screening: mammogram recommended at age 70 every other year at least, and annually after age 55.   Cervical cancer screening: Pap per OBGYN  Lung cancer screening: not needed for non-smokers. Infection screenings . HIV: recommended screening at least once age 66-65, more often as needed. . Gonorrhea/Chlamydia: screening as needed. . Hepatitis C: recommended for anyone born 71-1967 -not needed!  . TB: certain at-risk populations, or depending on work requirements and/or travel history Other . Bone Density Test: recommended for women at age 46.

## 2019-03-10 ENCOUNTER — Other Ambulatory Visit: Payer: Self-pay | Admitting: Osteopathic Medicine

## 2019-03-10 MED ORDER — VITAMIN D (ERGOCALCIFEROL) 1.25 MG (50000 UNIT) PO CAPS: 50000.0000 [IU] | ORAL_CAPSULE | ORAL | 0 refills | Status: DC

## 2019-03-14 ENCOUNTER — Encounter: Payer: Self-pay | Admitting: Osteopathic Medicine

## 2019-03-14 DIAGNOSIS — R42 Dizziness and giddiness: Secondary | ICD-10-CM

## 2019-03-24 DIAGNOSIS — E282 Polycystic ovarian syndrome: Secondary | ICD-10-CM | POA: Diagnosis not present

## 2019-03-24 DIAGNOSIS — N915 Oligomenorrhea, unspecified: Secondary | ICD-10-CM | POA: Diagnosis not present

## 2019-03-24 DIAGNOSIS — N83201 Unspecified ovarian cyst, right side: Secondary | ICD-10-CM | POA: Diagnosis not present

## 2019-03-24 DIAGNOSIS — R102 Pelvic and perineal pain: Secondary | ICD-10-CM | POA: Diagnosis not present

## 2019-05-12 DIAGNOSIS — R002 Palpitations: Secondary | ICD-10-CM | POA: Insufficient documentation

## 2019-05-12 DIAGNOSIS — R079 Chest pain, unspecified: Secondary | ICD-10-CM | POA: Diagnosis not present

## 2019-05-31 ENCOUNTER — Other Ambulatory Visit: Payer: Self-pay | Admitting: Osteopathic Medicine

## 2019-06-01 DIAGNOSIS — N83201 Unspecified ovarian cyst, right side: Secondary | ICD-10-CM | POA: Diagnosis not present

## 2019-06-01 DIAGNOSIS — E282 Polycystic ovarian syndrome: Secondary | ICD-10-CM | POA: Diagnosis not present

## 2019-06-01 DIAGNOSIS — R102 Pelvic and perineal pain: Secondary | ICD-10-CM | POA: Diagnosis not present

## 2019-06-06 DIAGNOSIS — R42 Dizziness and giddiness: Secondary | ICD-10-CM | POA: Diagnosis not present

## 2019-06-06 DIAGNOSIS — R519 Headache, unspecified: Secondary | ICD-10-CM | POA: Diagnosis not present

## 2020-03-08 ENCOUNTER — Ambulatory Visit (INDEPENDENT_AMBULATORY_CARE_PROVIDER_SITE_OTHER): Payer: PRIVATE HEALTH INSURANCE | Admitting: Osteopathic Medicine

## 2020-03-08 ENCOUNTER — Encounter: Payer: Self-pay | Admitting: Osteopathic Medicine

## 2020-03-08 ENCOUNTER — Other Ambulatory Visit: Payer: Self-pay

## 2020-03-08 ENCOUNTER — Ambulatory Visit (INDEPENDENT_AMBULATORY_CARE_PROVIDER_SITE_OTHER): Payer: PRIVATE HEALTH INSURANCE

## 2020-03-08 VITALS — BP 123/57 | HR 79 | Ht 69.49 in | Wt 257.1 lb

## 2020-03-08 DIAGNOSIS — E559 Vitamin D deficiency, unspecified: Secondary | ICD-10-CM | POA: Insufficient documentation

## 2020-03-08 DIAGNOSIS — Z0000 Encounter for general adult medical examination without abnormal findings: Secondary | ICD-10-CM | POA: Diagnosis not present

## 2020-03-08 DIAGNOSIS — M47816 Spondylosis without myelopathy or radiculopathy, lumbar region: Secondary | ICD-10-CM

## 2020-03-08 DIAGNOSIS — Z833 Family history of diabetes mellitus: Secondary | ICD-10-CM | POA: Diagnosis not present

## 2020-03-08 DIAGNOSIS — E049 Nontoxic goiter, unspecified: Secondary | ICD-10-CM

## 2020-03-08 DIAGNOSIS — K219 Gastro-esophageal reflux disease without esophagitis: Secondary | ICD-10-CM | POA: Diagnosis not present

## 2020-03-08 DIAGNOSIS — E282 Polycystic ovarian syndrome: Secondary | ICD-10-CM

## 2020-03-08 DIAGNOSIS — G8929 Other chronic pain: Secondary | ICD-10-CM | POA: Insufficient documentation

## 2020-03-08 DIAGNOSIS — M545 Low back pain, unspecified: Secondary | ICD-10-CM

## 2020-03-08 DIAGNOSIS — M67439 Ganglion, unspecified wrist: Secondary | ICD-10-CM

## 2020-03-08 LAB — HM MAMMOGRAPHY: HM Mammogram: ABNORMAL — AB (ref 0–4)

## 2020-03-08 NOTE — Progress Notes (Signed)
HPI: Anita Thompson is a 40 y.o. female who  has a past medical history of Abnormal Pap smear of cervix (05/22/2016), Acne, PCOS (polycystic ovarian syndrome), and Vision abnormalities (01/12/2018).  she presents to [facility] today, 03/08/19,  for chief complaint of: Annual physical.  Patient here for annual physical  Chronic lower back pain, bilateral but radiates a bit into the left hip area.  Would like to possibly seek injections for this, no previous MRI  Lump on right wrist consistent with ganglion cyst, patient reports it is nonpainful and does not bother her.   Past medical, surgical, social and family history reviewed:  Patient Active Problem List   Diagnosis Date Noted   Ganglion of wrist 03/08/2020   Chronic bilateral low back pain without sciatica 03/08/2020   Thyroid enlarged 03/08/2020   PCOS (polycystic ovarian syndrome) 03/08/2020   Vitamin D deficiency 03/08/2020   Palpitations 05/12/2019   Vision abnormalities 01/12/2018   Family history of diabetes mellitus in mother 10/13/2017   Family history of stroke or transient ischemic attack in mother 10/13/2017   Abnormal Pap smear of cervix 05/22/2016   Anxiety 02/04/2016   Gastroesophageal reflux disease without esophagitis 02/04/2016   Chronic constipation 07/04/2013   Acne 04/09/2011    Past Surgical History:  Procedure Laterality Date   BREAST REDUCTION SURGERY  2000    Social History   Tobacco Use   Smoking status: Never Smoker   Smokeless tobacco: Never Used  Substance Use Topics   Alcohol use: Not Currently    Family History  Problem Relation Age of Onset   High blood pressure Mother    Diabetes Mother    Stroke Mother    High Cholesterol Mother      Current medication list and allergy/intolerance information reviewed:    Current Outpatient Medications  Medication Sig Dispense Refill   cholecalciferol (VITAMIN D3) 25 MCG (1000  UNIT) tablet Take 1,000 Units by mouth daily.     tretinoin (RETIN-A) 0.1 % cream Apply topically at bedtime. 45 g 2   No current facility-administered medications for this visit.    No Known Allergies      Exam:  BP (!) 123/57 (BP Location: Left Arm, Patient Position: Sitting, Cuff Size: Large)    Pulse 79    Ht 5' 9.49" (1.765 m)    Wt 257 lb 1.6 oz (116.6 kg)    SpO2 99%    BMI 37.44 kg/m   Constitutional: VS see above. General Appearance: alert, well-developed, well-nourished, NAD  Eyes: Normal lids and conjunctive, non-icteric sclera  Ears, Nose, Mouth, Throat:TM normal bilaterally  Neck: No masses, trachea midline. No thyroid enlargement. No tenderness/mass appreciated. No lymphadenopathy  Respiratory: Normal respiratory effort. no wheeze, no rhonchi, no rales  Cardiovascular: S1/S2 normal, no murmur, no rub/gallop auscultated. RRR. No lower extremity edema.   Gastrointestinal: Nontender, no masses. No hepatomegaly, no splenomegaly. No hernia appreciated. Bowel sounds normal. Rectal exam deferred.   Musculoskeletal: Gait normal. No clubbing/cyanosis of digits.  Tenderness to paraspinal musculature lower lumbar spine around L4/L5, L5/L1.  Negative straight leg raise bilaterally.  Ganglion cyst present on right wrist   Neurological: Normal balance/coordination. No tremor.   Skin: warm, dry, intact. No rash/ulcer. No concerning nevi or subq nodules on limited exam.    Psychiatric: Normal judgment/insight. Normal mood and affect. Oriented x3.    Results for orders placed or performed in visit on 03/08/20 (from the past 72 hour(s))  CBC  Status: None   Collection Time: 03/08/20 12:00 AM  Result Value Ref Range   WBC 4.7 3.8 - 10.8 Thousand/uL   RBC 4.80 3.80 - 5.10 Million/uL   Hemoglobin 13.0 11.7 - 15.5 g/dL   HCT 16.139.5 09.635.0 - 04.545.0 %   MCV 82.3 80.0 - 100.0 fL   MCH 27.1 27.0 - 33.0 pg   MCHC 32.9 32.0 - 36.0 g/dL   RDW 40.914.5 81.111.0 - 91.415.0 %   Platelets 211 140 -  400 Thousand/uL   MPV 11.0 7.5 - 12.5 fL  COMPLETE METABOLIC PANEL WITH GFR     Status: None   Collection Time: 03/08/20 12:00 AM  Result Value Ref Range   Glucose, Bld 91 65 - 99 mg/dL    Comment: .            Fasting reference interval .    BUN 13 7 - 25 mg/dL   Creat 7.820.69 9.560.50 - 2.131.10 mg/dL   GFR, Est Non African American 108 > OR = 60 mL/min/1.2273m2   GFR, Est African American 125 > OR = 60 mL/min/1.2573m2   BUN/Creatinine Ratio NOT APPLICABLE 6 - 22 (calc)   Sodium 139 135 - 146 mmol/L   Potassium 4.0 3.5 - 5.3 mmol/L   Chloride 106 98 - 110 mmol/L   CO2 27 20 - 32 mmol/L   Calcium 9.6 8.6 - 10.2 mg/dL   Total Protein 7.6 6.1 - 8.1 g/dL   Albumin 4.5 3.6 - 5.1 g/dL   Globulin 3.1 1.9 - 3.7 g/dL (calc)   AG Ratio 1.5 1.0 - 2.5 (calc)   Total Bilirubin 0.4 0.2 - 1.2 mg/dL   Alkaline phosphatase (APISO) 46 31 - 125 U/L   AST 19 10 - 30 U/L   ALT 14 6 - 29 U/L  Lipid panel     Status: Abnormal   Collection Time: 03/08/20 12:00 AM  Result Value Ref Range   Cholesterol 179 <200 mg/dL   HDL 49 (L) > OR = 50 mg/dL   Triglycerides 56 <086<150 mg/dL   LDL Cholesterol (Calc) 115 (H) mg/dL (calc)    Comment: Reference range: <100 . Desirable range <100 mg/dL for primary prevention;   <70 mg/dL for patients with CHD or diabetic patients  with > or = 2 CHD risk factors. Marland Kitchen. LDL-C is now calculated using the Martin-Hopkins  calculation, which is a validated novel method providing  better accuracy than the Friedewald equation in the  estimation of LDL-C.  Horald PollenMartin SS et al. Lenox AhrJAMA. 5784;696(292013;310(19): 2061-2068  (http://education.[facility].com/faq/FAQ164)    Total CHOL/HDL Ratio 3.7 <5.0 (calc)   Non-HDL Cholesterol (Calc) 130 (H) <130 mg/dL (calc)    Comment: For patients with diabetes plus 1 major ASCVD risk  factor, treating to a non-HDL-C goal of <100 mg/dL  (LDL-C of <52<70 mg/dL) is considered a therapeutic  option.   Hemoglobin A1c     Status: None   Collection Time: 03/08/20  12:00 AM  Result Value Ref Range   Hgb A1c MFr Bld 5.5 <5.7 % of total Hgb    Comment: For the purpose of screening for the presence of diabetes: . <5.7%       Consistent with the absence of diabetes 5.7-6.4%    Consistent with increased risk for diabetes             (prediabetes) > or =6.5%  Consistent with diabetes . This assay result is consistent with a decreased risk of diabetes. . Currently, no consensus exists  regarding use of hemoglobin A1c for diagnosis of diabetes in children. . According to American Diabetes Association (ADA) guidelines, hemoglobin A1c <7.0% represents optimal control in non-pregnant diabetic patients. Different metrics may apply to specific patient populations.  Standards of Medical Care in Diabetes(ADA). .    Mean Plasma Glucose 111 mg/dL   eAG (mmol/L) 6.2 mmol/L  VITAMIN D 25 Hydroxy (Vit-D Deficiency, Fractures)     Status: None   Collection Time: 03/08/20 12:00 AM  Result Value Ref Range   Vit D, 25-Hydroxy 39 30 - 100 ng/mL    Comment: Vitamin D Status         25-OH Vitamin D: . Deficiency:                    <20 ng/mL Insufficiency:             20 - 29 ng/mL Optimal:                 > or = 30 ng/mL . For 25-OH Vitamin D testing on patients on  D2-supplementation and patients for whom quantitation  of D2 and D3 fractions is required, the [facility]ureD(TM) 25-OH VIT D, (D2,D3), LC/MS/MS is recommended: order  code 62694 (patients >27yrs). See Note 1 . Note 1 . For additional information, please refer to  http://education.[facility].com/faq/FAQ199  (This link is being provided for informational/ educational purposes only.)   TSH(Reflex)     Status: None   Collection Time: 03/08/20 12:00 AM  Result Value Ref Range   TSH 0.77 mIU/L    Comment:           Reference Range .           > or = 20 Years  0.40-4.50 .                Pregnancy Ranges           First trimester    0.26-2.66           Second trimester   0.55-2.73            Third trimester    0.43-2.91   REFLEX TIQ     Status: None   Collection Time: 03/08/20 12:00 AM  Result Value Ref Range   REFLEX TIQ      Comment: OUR RECORDS INDICATE THAT YOU HAVE ORDERED TSH ORDER CODE 8018.  THIS IS A REFLEX-SPECIFIC ORDER CODE. HOWEVER, ONLY THE INITIAL TEST WAS PERFORMED, BECAUSE WE DO NOT HAVE A REFLEX TESTING AUTHORIZATION FORM ON FILE FOR YOU.  TO  PERFORM A REFLEX TEST WE NEED YOU TO SIGN AN AUTHORIZATION FORM  SPECIFYING (A) THE REFLEXIVE TEST AND (B) THE RESULTS THAT WILL  TRIGGER THE PERFORMANCE OF THE REFLEX TEST.  PLEASE CONTACT A  CLIENT SERVICE REPRESENTATIVE AT [facility] IF YOU WOULD  LIKE ADDITIONAL TESTING DONE OR CONTACT YOUR SALES REPRESENTATIVE  TO OBTAIN A COPY OF THE REFLEXIVE TESTING AUTHORIZATION FORM. . Varney Biles Lumbar Spine Complete  Result Date: 03/08/2020 CLINICAL DATA:  Chronic low back pain EXAM: LUMBAR SPINE - COMPLETE 4+ VIEW COMPARISON:  None. FINDINGS: Five lumbar type vertebral segments. Vertebral body heights and alignment are maintained. No fracture identified. Mild disc height loss of L5-S1. The remaining intervertebral disc spaces are relatively preserved. Minimal degenerative endplate spurring. Mild lower lumbar facet arthrosis. IMPRESSION: Mild lower lumbar spondylosis. Electronically Signed   By: Duanne Guess D.O.   On: 03/08/2020 15:17  ASSESSMENT/PLAN: The primary encounter diagnosis was Annual physical exam. Diagnoses of Family history of diabetes mellitus in mother, Vitamin D deficiency, Gastroesophageal reflux disease without esophagitis, PCOS (polycystic ovarian syndrome), Thyroid enlarged, Chronic bilateral low back pain without sciatica, and Ganglion of wrist, unspecified laterality were also pertinent to this visit.   Orders Placed This Encounter  Procedures   DG Lumbar Spine Complete   CBC   COMPLETE METABOLIC PANEL WITH GFR   Lipid panel   Hemoglobin A1c   VITAMIN D 25 Hydroxy  (Vit-D Deficiency, Fractures)   TSH(Reflex)   REFLEX TIQ    No orders of the defined types were placed in this encounter.   Patient Instructions     General Preventive Care  Most recent routine screening lipids/other labs: ordered today   Everyone should have blood pressure checked once per year.   Tobacco: don't!  Alcohol: responsible moderation is ok for most adults - if you have concerns about your alcohol intake, please talk to me!   Exercise: as tolerated to reduce risk of cardiovascular disease and diabetes. Strength training will also prevent osteoporosis.   Mental health: if need for mental health care (medicines, counseling, other), or concerns about moods, please let me know!   Sexual health: if need for STD testing, or if concerns with libido/pain problems, please let me know! If you need to discuss your birth control options, please let me know!   Advanced Directive: Living Will and/or Healthcare Power of Attorney recommended for all adults, regardless of age or health.  Vaccines  Flu vaccine: recommended every fall/winter  Shingles vaccine: Shingrix recommended after age 62.   Pneumonia vaccines: recommended after age 62.  Tetanus booster: Tdap recommended every 10 years. Due 2027.  COVID: THANKS for getting your vaccine :) recommend booster when due (6 mos after initial vaccine series)   Cancer screenings   Colon cancer screening: recommended for everyone at age 78  Breast cancer screening: mammogram recommended at age 22 every other year at least, and annually after age 9.   Cervical cancer screening: Pap per OBGYN  Lung cancer screening: not needed for non-smokers. Infection screenings  HIV and Hepatitis C: recommended screening at least once age 42-65, more often as needed.  Gonorrhea/Chlamydia: screening as needed.  TB: certain at-risk populations, or depending on work requirements and/or travel history Other  Bone Density Test:  recommended age 76.       Visit summary with medication list and pertinent instructions was printed for patient to review. All questions at time of visit were answered - patient instructed to contact office with any additional concerns or updates. ER/RTC precautions were reviewed with the patient.    Please note: voice recognition software was used to produce this document, and typos may escape review. Please contact Dr. Lyn Hollingshead for any needed clarifications.     Follow-up plan: Return in about 1 year (around 03/08/2021) for [place] (call week prior to visit for lab orders).

## 2020-03-08 NOTE — Patient Instructions (Addendum)
  General Preventive Care  Most recent routine screening lipids/other labs: ordered today   Everyone should have blood pressure checked once per year.   Tobacco: don't!  Alcohol: responsible moderation is ok for most adults - if you have concerns about your alcohol intake, please talk to me!   Exercise: as tolerated to reduce risk of cardiovascular disease and diabetes. Strength training will also prevent osteoporosis.   Mental health: if need for mental health care (medicines, counseling, other), or concerns about moods, please let me know!   Sexual health: if need for STD testing, or if concerns with libido/pain problems, please let me know! If you need to discuss your birth control options, please let me know!   Advanced Directive: Living Will and/or Healthcare Power of Attorney recommended for all adults, regardless of age or health.  Vaccines  Flu vaccine: recommended every fall/winter  Shingles vaccine: Shingrix recommended after age 32.   Pneumonia vaccines: recommended after age 21.  Tetanus booster: Tdap recommended every 10 years. Due 2027.  COVID: THANKS for getting your vaccine :) recommend booster when due (6 mos after initial vaccine series)   Cancer screenings   Colon cancer screening: recommended for everyone at age 63  Breast cancer screening: mammogram recommended at age 40 every other year at least, and annually after age 107.   Cervical cancer screening: Pap per OBGYN  Lung cancer screening: not needed for non-smokers. Infection screenings . HIV and Hepatitis C: recommended screening at least once age 82-65, more often as needed. . Gonorrhea/Chlamydia: screening as needed. . TB: certain at-risk populations, or depending on work requirements and/or travel history Other . Bone Density Test: recommended age 65.

## 2020-03-09 LAB — CBC
HCT: 39.5 % (ref 35.0–45.0)
Hemoglobin: 13 g/dL (ref 11.7–15.5)
MCH: 27.1 pg (ref 27.0–33.0)
MCHC: 32.9 g/dL (ref 32.0–36.0)
MCV: 82.3 fL (ref 80.0–100.0)
MPV: 11 fL (ref 7.5–12.5)
Platelets: 211 10*3/uL (ref 140–400)
RBC: 4.8 10*6/uL (ref 3.80–5.10)
RDW: 14.5 % (ref 11.0–15.0)
WBC: 4.7 10*3/uL (ref 3.8–10.8)

## 2020-03-09 LAB — HEMOGLOBIN A1C
Hgb A1c MFr Bld: 5.5 % of total Hgb (ref <5.7)
Mean Plasma Glucose: 111 mg/dL
eAG (mmol/L): 6.2 mmol/L

## 2020-03-09 LAB — COMPLETE METABOLIC PANEL WITH GFR
AG Ratio: 1.5 (calc) (ref 1.0–2.5)
ALT: 14 U/L (ref 6–29)
AST: 19 U/L (ref 10–30)
Albumin: 4.5 g/dL (ref 3.6–5.1)
Alkaline phosphatase (APISO): 46 U/L (ref 31–125)
BUN: 13 mg/dL (ref 7–25)
CO2: 27 mmol/L (ref 20–32)
Calcium: 9.6 mg/dL (ref 8.6–10.2)
Chloride: 106 mmol/L (ref 98–110)
Creat: 0.69 mg/dL (ref 0.50–1.10)
GFR, Est African American: 125 mL/min/{1.73_m2} (ref >=60)
GFR, Est Non African American: 108 mL/min/{1.73_m2} (ref >=60)
Globulin: 3.1 g/dL (calc) (ref 1.9–3.7)
Glucose, Bld: 91 mg/dL (ref 65–99)
Potassium: 4 mmol/L (ref 3.5–5.3)
Sodium: 139 mmol/L (ref 135–146)
Total Bilirubin: 0.4 mg/dL (ref 0.2–1.2)
Total Protein: 7.6 g/dL (ref 6.1–8.1)

## 2020-03-09 LAB — LIPID PANEL
Cholesterol: 179 mg/dL (ref <200)
HDL: 49 mg/dL — ABNORMAL LOW (ref >=50)
LDL Cholesterol (Calc): 115 mg/dL (calc) — ABNORMAL HIGH
Non-HDL Cholesterol (Calc): 130 mg/dL (calc) — ABNORMAL HIGH (ref <130)
Total CHOL/HDL Ratio: 3.7 (calc) (ref <5.0)
Triglycerides: 56 mg/dL (ref <150)

## 2020-03-09 LAB — REFLEX TIQ

## 2020-03-09 LAB — TSH(REFL): TSH: 0.77 mIU/L

## 2020-03-09 LAB — VITAMIN D 25 HYDROXY (VIT D DEFICIENCY, FRACTURES): Vit D, 25-Hydroxy: 39 ng/mL (ref 30–100)

## 2020-05-15 DIAGNOSIS — Z6838 Body mass index (BMI) 38.0-38.9, adult: Secondary | ICD-10-CM | POA: Diagnosis not present

## 2020-05-15 DIAGNOSIS — Z124 Encounter for screening for malignant neoplasm of cervix: Secondary | ICD-10-CM | POA: Diagnosis not present

## 2020-05-15 DIAGNOSIS — Z01419 Encounter for gynecological examination (general) (routine) without abnormal findings: Secondary | ICD-10-CM | POA: Diagnosis not present

## 2020-05-22 LAB — HM PAP SMEAR: HM Pap smear: NORMAL

## 2021-03-06 DIAGNOSIS — N938 Other specified abnormal uterine and vaginal bleeding: Secondary | ICD-10-CM | POA: Diagnosis not present

## 2021-03-06 DIAGNOSIS — N915 Oligomenorrhea, unspecified: Secondary | ICD-10-CM | POA: Diagnosis not present

## 2021-03-06 DIAGNOSIS — E282 Polycystic ovarian syndrome: Secondary | ICD-10-CM | POA: Diagnosis not present

## 2021-03-11 ENCOUNTER — Ambulatory Visit (INDEPENDENT_AMBULATORY_CARE_PROVIDER_SITE_OTHER): Payer: BC Managed Care – PPO | Admitting: Medical-Surgical

## 2021-03-11 ENCOUNTER — Encounter: Payer: Self-pay | Admitting: Medical-Surgical

## 2021-03-11 ENCOUNTER — Other Ambulatory Visit: Payer: Self-pay

## 2021-03-11 ENCOUNTER — Encounter: Payer: PRIVATE HEALTH INSURANCE | Admitting: Osteopathic Medicine

## 2021-03-11 VITALS — BP 110/71 | HR 87 | Temp 97.9°F | Ht 67.5 in | Wt 239.2 lb

## 2021-03-11 DIAGNOSIS — Z0000 Encounter for general adult medical examination without abnormal findings: Secondary | ICD-10-CM | POA: Diagnosis not present

## 2021-03-11 DIAGNOSIS — E049 Nontoxic goiter, unspecified: Secondary | ICD-10-CM | POA: Diagnosis not present

## 2021-03-11 DIAGNOSIS — Z833 Family history of diabetes mellitus: Secondary | ICD-10-CM | POA: Diagnosis not present

## 2021-03-11 DIAGNOSIS — Z1159 Encounter for screening for other viral diseases: Secondary | ICD-10-CM

## 2021-03-11 DIAGNOSIS — Z114 Encounter for screening for human immunodeficiency virus [HIV]: Secondary | ICD-10-CM

## 2021-03-11 DIAGNOSIS — E282 Polycystic ovarian syndrome: Secondary | ICD-10-CM | POA: Diagnosis not present

## 2021-03-11 DIAGNOSIS — E559 Vitamin D deficiency, unspecified: Secondary | ICD-10-CM

## 2021-03-11 MED ORDER — OZEMPIC (0.25 OR 0.5 MG/DOSE) 2 MG/1.5ML ~~LOC~~ SOPN: 0.5000 mg | PEN_INJECTOR | SUBCUTANEOUS | 0 refills | Status: DC

## 2021-03-11 NOTE — Progress Notes (Signed)
HPI: Anita Thompson is a 41 y.o. female who  has a past medical history of Abnormal Pap smear of cervix (05/22/2016), Acne, PCOS (polycystic ovarian syndrome), and Vision abnormalities (01/12/2018).  she presents to [facility] today, 03/11/21,  for chief complaint of: Annual physical exam  Dentist: Overdue Eye exam: 2020, left eye vision impairment since childhood Exercise: Approximately 4 times weekly does weights and cardio Diet: No dietary restrictions, working on making changes for weight loss Pap smear: OB/GYN is [place] [facility], records requested Mammogram: Completed last year, upcoming appointment COVID vaccine: Done, no booster  Concerns: Weight gain-having a lot of difficulty losing weight despite dietary modifications and exercise.  Is interested and checking her hormones as well as possibly starting Ozempic/Wegovy.  Past medical, surgical, social and family history reviewed:  Patient Active Problem List   Diagnosis Date Noted   Ganglion of wrist 03/08/2020   Chronic bilateral low back pain without sciatica 03/08/2020   Thyroid enlarged 03/08/2020   PCOS (polycystic ovarian syndrome) 03/08/2020   Vitamin D deficiency 03/08/2020   Palpitations 05/12/2019   Vision abnormalities 01/12/2018   Family history of diabetes mellitus in mother 10/13/2017   Family history of stroke or transient ischemic attack in mother 10/13/2017   Abnormal Pap smear of cervix 05/22/2016   Anxiety 02/04/2016   Gastroesophageal reflux disease without esophagitis 02/04/2016   Chronic constipation 07/04/2013   Acne 04/09/2011    Past Surgical History:  Procedure Laterality Date   BREAST REDUCTION SURGERY  2000    Social History   Tobacco Use   Smoking status: Never   Smokeless tobacco: Never  Substance Use Topics   Alcohol use: Not Currently    Family History  Problem Relation Age of Onset   High blood pressure Mother    Diabetes  Mother    Stroke Mother    High Cholesterol Mother      Current medication list and allergy/intolerance information reviewed:    Current Outpatient Medications  Medication Sig Dispense Refill   cholecalciferol (VITAMIN D3) 25 MCG (1000 UNIT) tablet Take 1,000 Units by mouth daily.     Semaglutide,0.25 or 0.5MG/DOS, (OZEMPIC, 0.25 OR 0.5 MG/DOSE,) 2 MG/1.5ML SOPN Inject 0.5 mg into the skin once a week. 1.5 mL 0   tretinoin (RETIN-A) 0.1 % cream Apply topically at bedtime. 45 g 2   No current facility-administered medications for this visit.    No Known Allergies    Review of Systems: Constitutional:  No  fever, no chills, No recent illness, No unintentional weight changes. No significant fatigue.  HEENT: No  headache, no vision change, no hearing change, No sore throat, No  sinus pressure Cardiac: No  chest pain, No  pressure, No palpitations, No  Orthopnea Respiratory:  No  shortness of breath. No  Cough Gastrointestinal: No  abdominal pain, No  nausea, No  vomiting,  No  blood in stool, No  diarrhea, No  constipation, + excess gas/belching Musculoskeletal: Bilateral hip pain, worse in the morning and after sitting for long periods, better with activity Skin: No  Rash, No other wounds/concerning lesions Genitourinary: No  incontinence, No  abnormal genital bleeding, No abnormal genital discharge Hem/Onc: No  easy bruising/bleeding, No  abnormal lymph node Endocrine: No cold intolerance,  No heat intolerance. No polyuria/polydipsia/polyphagia  Neurologic: No  weakness, No  dizziness, No  slurred speech/focal weakness/facial droop Psychiatric: No  concerns with depression, No  concerns with anxiety, No sleep problems, No mood  problems  Exam:  BP 110/71    Pulse 87    Temp 97.9 F (36.6 C)    Ht 5' 7.5" (1.715 m)    Wt 239 lb 3.2 oz (108.5 kg)    SpO2 98%    BMI 36.91 kg/m  Constitutional: VS see above. General Appearance: alert, well-developed, well-nourished, NAD Eyes: Normal  lids and conjunctive, non-icteric sclera Ears, Nose, Mouth, Throat: MMM, Normal external inspection ears/nares/mouth/lips/gums. TM normal bilaterally.  Neck: No masses, trachea midline.  + Thyroid enlargement. No tenderness/mass appreciated. No lymphadenopathy Respiratory: Normal respiratory effort. no wheeze, no rhonchi, no rales Cardiovascular: S1/S2 normal, no murmur, no rub/gallop auscultated. RRR. No lower extremity edema. Pedal pulse II/IV bilaterally PT. No carotid bruit or JVD. No abdominal aortic bruit. Gastrointestinal: Nontender, no masses. No hepatomegaly, no splenomegaly. No hernia appreciated. Bowel sounds normal. Rectal exam deferred.  Musculoskeletal: Gait normal. No clubbing/cyanosis of digits.  Neurological: Normal balance/coordination. No tremor. No cranial nerve deficit on limited exam. Motor and sensation intact and symmetric. Cerebellar reflexes intact.  Skin: warm, dry, intact. No rash/ulcer. No concerning nevi or subq nodules on limited exam.   Psychiatric: Normal judgment/insight. Normal mood and affect. Oriented x3.   ASSESSMENT/PLAN:   1. Annual physical exam Checking labs as below.  Preventative care information provided with AVS.  Recommend updating eye exam and dental care. - Lipid panel - COMPLETE METABOLIC PANEL WITH GFR - CBC with Differential/Platelet  2. Thyroid enlarged Checking TSH. - TSH  3. PCOS (polycystic ovarian syndrome) Checking hemoglobin A1c. - Hemoglobin A1c  4. Family history of diabetes mellitus in mother Checking A1c. - Hemoglobin A1c  5. Vitamin D deficiency Checking vitamin D. - VITAMIN D 25 Hydroxy (Vit-D Deficiency, Fractures)  6. Encounter for screening for HIV 7. Need for hepatitis C screening test Discussed cranial conditions.  Patient is agreeable so adding to blood work today. - HIV Antibody (routine testing w rflx) - Hepatitis C Antibody  Orders Placed This Encounter  Procedures   TSH   Lipid panel   COMPLETE  METABOLIC PANEL WITH GFR   CBC with Differential/Platelet   Hemoglobin A1c   VITAMIN D 25 Hydroxy (Vit-D Deficiency, Fractures)    Meds ordered this encounter  Medications   Semaglutide,0.25 or 0.5MG/DOS, (OZEMPIC, 0.25 OR 0.5 MG/DOSE,) 2 MG/1.5ML SOPN    Sig: Inject 0.5 mg into the skin once a week.    Dispense:  1.5 mL    Refill:  0    Order Specific Question:   Supervising Provider    Answer:   Luetta Nutting [[id number]]    Patient Instructions  Macro diet Reverse dieting  Preventive Care 17-44 Years Old, Female Preventive care refers to lifestyle choices and visits with your health care provider that can promote health and wellness. Preventive care visits are also called wellness exams. What can I expect for my preventive care visit? Counseling Your health care provider may ask you questions about your: Medical history, including: Past medical problems. Family medical history. Pregnancy history. Current health, including: Menstrual cycle. Method of birth control. Emotional well-being. Home life and relationship well-being. Sexual activity and sexual health. Lifestyle, including: Alcohol, nicotine or tobacco, and drug use. Access to firearms. Diet, exercise, and sleep habits. Work and work Statistician. Sunscreen use. Safety issues such as seatbelt and bike helmet use. Physical exam Your health care provider will check your: Height and weight. These may be used to calculate your BMI (body mass index). BMI is a measurement that tells if you  are at a healthy weight. Waist circumference. This measures the distance around your waistline. This measurement also tells if you are at a healthy weight and may help predict your risk of certain diseases, such as type 2 diabetes and high blood pressure. Heart rate and blood pressure. Body temperature. Skin for abnormal spots. What immunizations do I need? Vaccines are usually given at various ages, according to a schedule. Your  health care provider will recommend vaccines for you based on your age, medical history, and lifestyle or other factors, such as travel or where you work. What tests do I need? Screening Your health care provider may recommend screening tests for certain conditions. This may include: Lipid and cholesterol levels. Diabetes screening. This is done by checking your blood sugar (glucose) after you have not eaten for a while (fasting). Pelvic exam and Pap test. Hepatitis B test. Hepatitis C test. HIV (human immunodeficiency virus) test. STI (sexually transmitted infection) testing, if you are at risk. Lung cancer screening. Colorectal cancer screening. Mammogram. Talk with your health care provider about when you should start having regular mammograms. This may depend on whether you have a family history of breast cancer. BRCA-related cancer screening. This may be done if you have a family history of breast, ovarian, tubal, or peritoneal cancers. Bone density scan. This is done to screen for osteoporosis. Talk with your health care provider about your test results, treatment options, and if necessary, the need for more tests. Follow these instructions at home: Eating and drinking  Eat a diet that includes fresh fruits and vegetables, whole grains, lean protein, and low-fat dairy products. Take vitamin and mineral supplements as recommended by your health care provider. Do not drink alcohol if: Your health care provider tells you not to drink. You are pregnant, may be pregnant, or are planning to become pregnant. If you drink alcohol: Limit how much you have to 0-1 drink a day. Know how much alcohol is in your drink. In the [place] one drink equals one 12 oz bottle of beer (355 mL), one 5 oz glass of wine (148 mL), or one 1 oz glass of hard liquor (44 mL). Lifestyle Brush your teeth every morning and night with fluoride toothpaste. Floss one time each day. Exercise for at least 30 minutes 5 or  more days each week. Do not use any products that contain nicotine or tobacco. These products include cigarettes, chewing tobacco, and vaping devices, such as e-cigarettes. If you need help quitting, ask your health care provider. Do not use drugs. If you are sexually active, practice safe sex. Use a condom or other form of protection to prevent STIs. If you do not wish to become pregnant, use a form of birth control. If you plan to become pregnant, see your health care provider for a prepregnancy visit. Take aspirin only as told by your health care provider. Make sure that you understand how much to take and what form to take. Work with your health care provider to find out whether it is safe and beneficial for you to take aspirin daily. Find healthy ways to manage stress, such as: Meditation, yoga, or listening to music. Journaling. Talking to a trusted person. Spending time with friends and family. Minimize exposure to UV radiation to reduce your risk of skin cancer. Safety Always wear your seat belt while driving or riding in a vehicle. Do not drive: If you have been drinking alcohol. Do not ride with someone who has been drinking. When you are  tired or distracted. While texting. If you have been using any mind-altering substances or drugs. Wear a helmet and other protective equipment during sports activities. If you have firearms in your house, make sure you follow all gun safety procedures. Seek help if you have been physically or sexually abused. What's next? Visit your health care provider once a year for an annual wellness visit. Ask your health care provider how often you should have your eyes and teeth checked. Stay up to date on all vaccines. This information is not intended to replace advice given to you by your health care provider. Make sure you discuss any questions you have with your health care provider. Document Revised: 08/01/2020 Document Reviewed: 08/01/2020 Elsevier  Patient Education  [place].   Follow-up plan: Return in about 4 weeks (around 04/08/2021) for weight check.  Clearnce Sorrel, DNP, APRN, FNP-BC [facility]

## 2021-03-11 NOTE — Patient Instructions (Signed)
Macro diet Reverse dieting  Preventive Care 43-43 Years Old, Female Preventive care refers to lifestyle choices and visits with your health care provider that can promote health and wellness. Preventive care visits are also called wellness exams. What can I expect for my preventive care visit? Counseling Your health care provider may ask you questions about your: Medical history, including: Past medical problems. Family medical history. Pregnancy history. Current health, including: Menstrual cycle. Method of birth control. Emotional well-being. Home life and relationship well-being. Sexual activity and sexual health. Lifestyle, including: Alcohol, nicotine or tobacco, and drug use. Access to firearms. Diet, exercise, and sleep habits. Work and work Statistician. Sunscreen use. Safety issues such as seatbelt and bike helmet use. Physical exam Your health care provider will check your: Height and weight. These may be used to calculate your BMI (body mass index). BMI is a measurement that tells if you are at a healthy weight. Waist circumference. This measures the distance around your waistline. This measurement also tells if you are at a healthy weight and may help predict your risk of certain diseases, such as type 2 diabetes and high blood pressure. Heart rate and blood pressure. Body temperature. Skin for abnormal spots. What immunizations do I need? Vaccines are usually given at various ages, according to a schedule. Your health care provider will recommend vaccines for you based on your age, medical history, and lifestyle or other factors, such as travel or where you work. What tests do I need? Screening Your health care provider may recommend screening tests for certain conditions. This may include: Lipid and cholesterol levels. Diabetes screening. This is done by checking your blood sugar (glucose) after you have not eaten for a while (fasting). Pelvic exam and Pap  test. Hepatitis B test. Hepatitis C test. HIV (human immunodeficiency virus) test. STI (sexually transmitted infection) testing, if you are at risk. Lung cancer screening. Colorectal cancer screening. Mammogram. Talk with your health care provider about when you should start having regular mammograms. This may depend on whether you have a family history of breast cancer. BRCA-related cancer screening. This may be done if you have a family history of breast, ovarian, tubal, or peritoneal cancers. Bone density scan. This is done to screen for osteoporosis. Talk with your health care provider about your test results, treatment options, and if necessary, the need for more tests. Follow these instructions at home: Eating and drinking  Eat a diet that includes fresh fruits and vegetables, whole grains, lean protein, and low-fat dairy products. Take vitamin and mineral supplements as recommended by your health care provider. Do not drink alcohol if: Your health care provider tells you not to drink. You are pregnant, may be pregnant, or are planning to become pregnant. If you drink alcohol: Limit how much you have to 0-1 drink a day. Know how much alcohol is in your drink. In the [place] one drink equals one 12 oz bottle of beer (355 mL), one 5 oz glass of wine (148 mL), or one 1 oz glass of hard liquor (44 mL). Lifestyle Brush your teeth every morning and night with fluoride toothpaste. Floss one time each day. Exercise for at least 30 minutes 5 or more days each week. Do not use any products that contain nicotine or tobacco. These products include cigarettes, chewing tobacco, and vaping devices, such as e-cigarettes. If you need help quitting, ask your health care provider. Do not use drugs. If you are sexually active, practice safe sex. Use a condom or other  form of protection to prevent STIs. If you do not wish to become pregnant, use a form of birth control. If you plan to become pregnant,  see your health care provider for a prepregnancy visit. Take aspirin only as told by your health care provider. Make sure that you understand how much to take and what form to take. Work with your health care provider to find out whether it is safe and beneficial for you to take aspirin daily. Find healthy ways to manage stress, such as: Meditation, yoga, or listening to music. Journaling. Talking to a trusted person. Spending time with friends and family. Minimize exposure to UV radiation to reduce your risk of skin cancer. Safety Always wear your seat belt while driving or riding in a vehicle. Do not drive: If you have been drinking alcohol. Do not ride with someone who has been drinking. When you are tired or distracted. While texting. If you have been using any mind-altering substances or drugs. Wear a helmet and other protective equipment during sports activities. If you have firearms in your house, make sure you follow all gun safety procedures. Seek help if you have been physically or sexually abused. What's next? Visit your health care provider once a year for an annual wellness visit. Ask your health care provider how often you should have your eyes and teeth checked. Stay up to date on all vaccines. This information is not intended to replace advice given to you by your health care provider. Make sure you discuss any questions you have with your health care provider. Document Revised: 08/01/2020 Document Reviewed: 08/01/2020 Elsevier Patient Education  [place].

## 2021-03-12 DIAGNOSIS — Z114 Encounter for screening for human immunodeficiency virus [HIV]: Secondary | ICD-10-CM | POA: Diagnosis not present

## 2021-03-12 DIAGNOSIS — E049 Nontoxic goiter, unspecified: Secondary | ICD-10-CM | POA: Diagnosis not present

## 2021-03-12 DIAGNOSIS — Z1159 Encounter for screening for other viral diseases: Secondary | ICD-10-CM | POA: Diagnosis not present

## 2021-03-12 DIAGNOSIS — E559 Vitamin D deficiency, unspecified: Secondary | ICD-10-CM | POA: Diagnosis not present

## 2021-03-12 DIAGNOSIS — Z833 Family history of diabetes mellitus: Secondary | ICD-10-CM | POA: Diagnosis not present

## 2021-03-12 DIAGNOSIS — E282 Polycystic ovarian syndrome: Secondary | ICD-10-CM | POA: Diagnosis not present

## 2021-03-13 LAB — COMPLETE METABOLIC PANEL WITH GFR
AG Ratio: 1.5 (calc) (ref 1.0–2.5)
ALT: 16 U/L (ref 6–29)
AST: 21 U/L (ref 10–30)
Albumin: 4.3 g/dL (ref 3.6–5.1)
Alkaline phosphatase (APISO): 41 U/L (ref 31–125)
BUN: 12 mg/dL (ref 7–25)
CO2: 31 mmol/L (ref 20–32)
Calcium: 9.3 mg/dL (ref 8.6–10.2)
Chloride: 106 mmol/L (ref 98–110)
Creat: 0.78 mg/dL (ref 0.50–0.99)
Globulin: 2.9 g/dL (calc) (ref 1.9–3.7)
Glucose, Bld: 91 mg/dL (ref 65–99)
Potassium: 3.9 mmol/L (ref 3.5–5.3)
Sodium: 140 mmol/L (ref 135–146)
Total Bilirubin: 0.4 mg/dL (ref 0.2–1.2)
Total Protein: 7.2 g/dL (ref 6.1–8.1)
eGFR: 97 mL/min/{1.73_m2} (ref >=60)

## 2021-03-13 LAB — CBC WITH DIFFERENTIAL/PLATELET
Absolute Monocytes: 374 cells/uL (ref 200–950)
Basophils Absolute: 21 cells/uL (ref 0–200)
Basophils Relative: 0.8 %
Eosinophils Absolute: 109 cells/uL (ref 15–500)
Eosinophils Relative: 4.2 %
HCT: 36.4 % (ref 35.0–45.0)
Hemoglobin: 11.8 g/dL (ref 11.7–15.5)
Lymphs Abs: 1235 cells/uL (ref 850–3900)
MCH: 27 pg (ref 27.0–33.0)
MCHC: 32.4 g/dL (ref 32.0–36.0)
MCV: 83.3 fL (ref 80.0–100.0)
MPV: 10.9 fL (ref 7.5–12.5)
Monocytes Relative: 14.4 %
Neutro Abs: 861 cells/uL — ABNORMAL LOW (ref 1500–7800)
Neutrophils Relative %: 33.1 %
Platelets: 213 10*3/uL (ref 140–400)
RBC: 4.37 10*6/uL (ref 3.80–5.10)
RDW: 14.4 % (ref 11.0–15.0)
Total Lymphocyte: 47.5 %
WBC: 2.6 10*3/uL — ABNORMAL LOW (ref 3.8–10.8)

## 2021-03-13 LAB — HEPATITIS C ANTIBODY
Hepatitis C Ab: NONREACTIVE
SIGNAL TO CUT-OFF: 0.15 (ref <1.00)

## 2021-03-13 LAB — HEMOGLOBIN A1C
Hgb A1c MFr Bld: 5.4 % of total Hgb (ref <5.7)
Mean Plasma Glucose: 108 mg/dL
eAG (mmol/L): 6 mmol/L

## 2021-03-13 LAB — HIV ANTIBODY (ROUTINE TESTING W REFLEX): HIV 1&2 Ab, 4th Generation: NONREACTIVE

## 2021-03-13 LAB — LIPID PANEL
Cholesterol: 131 mg/dL (ref <200)
HDL: 38 mg/dL — ABNORMAL LOW (ref >=50)
LDL Cholesterol (Calc): 79 mg/dL (calc)
Non-HDL Cholesterol (Calc): 93 mg/dL (calc) (ref <130)
Total CHOL/HDL Ratio: 3.4 (calc) (ref <5.0)
Triglycerides: 55 mg/dL (ref <150)

## 2021-03-13 LAB — VITAMIN D 25 HYDROXY (VIT D DEFICIENCY, FRACTURES): Vit D, 25-Hydroxy: 35 ng/mL (ref 30–100)

## 2021-03-13 LAB — TSH: TSH: 0.46 mIU/L

## 2021-03-14 ENCOUNTER — Telehealth: Payer: Self-pay

## 2021-03-14 NOTE — Telephone Encounter (Signed)
Mammogram and pap results faxed by [facility]. Abstracted and sent to scan.

## 2021-03-23 ENCOUNTER — Other Ambulatory Visit: Payer: Self-pay | Admitting: Medical-Surgical

## 2021-03-26 ENCOUNTER — Telehealth: Payer: Self-pay

## 2021-03-26 NOTE — Telephone Encounter (Signed)
Pa has been submitted currently pending please see telephone encounter for updates on the request

## 2021-03-26 NOTE — Telephone Encounter (Addendum)
Initiated Prior authorization [id number],4.19 or 0.5MG /DOS, (OZEMPIC, 0.25  Via: Covermymeds Case/Key:[id number] Status: approved  as of 03/26/21 Reason: Effective from 04/09/2021 through 04/08/2022. Notified Pt via: Mychart

## 2021-04-16 ENCOUNTER — Ambulatory Visit: Payer: BC Managed Care – PPO | Admitting: Medical-Surgical

## 2021-05-05 ENCOUNTER — Encounter: Payer: Self-pay | Admitting: Medical-Surgical

## 2021-05-07 MED ORDER — SEMAGLUTIDE (1 MG/DOSE) 4 MG/3ML ~~LOC~~ SOPN: 1.0000 mg | PEN_INJECTOR | SUBCUTANEOUS | 0 refills | Status: AC

## 2021-05-07 NOTE — Telephone Encounter (Signed)
1mg  pen sent in.  Thanks!

## 2021-05-07 NOTE — Addendum Note (Signed)
Addended by: Mammie Lorenzo on: 05/07/2021 11:04 AM ? ? Modules accepted: Orders ? ?

## 2021-05-16 NOTE — Progress Notes (Signed)
?  HPI with pertinent ROS:  ? ?CC: weight check ? ?HPI: ?Pleasant 41 year old female presenting today for weight check on ozempic. Has been taking 0.5mg  weekly for the last six weeks or so and moved to the 1mg  dose  last week. So far is tolerating the increased dose well without side effects. She has underlying constipation issues chronically and notes that this may be a little worse than it usually is but is not severe enough to stop the medicine. Is exercising regularly doing mostly cardio but some strength training. Doing intermittent fasting and making healthier dietary choices.  ? ?I reviewed the past medical history, family history, social history, surgical history, and allergies today and no changes were needed.  Please see the problem list section below in epic for further details. ? ? ?Physical exam:  ? ?General: Well Developed, well nourished, and in no acute distress.  ?Neuro: Alert and oriented x3.  ?HEENT: Normocephalic, atraumatic.  ?Skin: Warm and dry. ?Cardiac: Regular rate and rhythm, no murmurs rubs or gallops, no lower extremity edema.  ?Respiratory: Clear to auscultation bilaterally. Not using accessory muscles, speaking in full sentences. ? ?Impression and Recommendations:   ? ?1. Encounter for weight management ?Approximately 5lb weight loss over the last couple of months. Continue Ozempic 1mg  weekly. Continue regular intentional exercise. Work on dietary modifications to  limit caloric intake and increase protein.  ? ?Return in about 2 months (around 07/17/2021) for weight check. ?___________________________________________ ?Clearnce Sorrel, DNP, APRN, FNP-BC ?[facility] ?[place] ?

## 2021-05-17 ENCOUNTER — Encounter: Payer: Self-pay | Admitting: Medical-Surgical

## 2021-05-17 ENCOUNTER — Ambulatory Visit: Payer: BC Managed Care – PPO | Admitting: Medical-Surgical

## 2021-05-17 VITALS — BP 113/75 | HR 87 | Resp 20 | Ht 67.5 in | Wt 234.0 lb

## 2021-05-17 DIAGNOSIS — Z7689 Persons encountering health services in other specified circumstances: Secondary | ICD-10-CM

## 2021-05-20 DIAGNOSIS — Z1231 Encounter for screening mammogram for malignant neoplasm of breast: Secondary | ICD-10-CM | POA: Diagnosis not present

## 2021-05-20 DIAGNOSIS — Z6835 Body mass index (BMI) 35.0-35.9, adult: Secondary | ICD-10-CM | POA: Diagnosis not present

## 2021-05-20 DIAGNOSIS — Z01419 Encounter for gynecological examination (general) (routine) without abnormal findings: Secondary | ICD-10-CM | POA: Diagnosis not present

## 2021-06-05 DIAGNOSIS — D251 Intramural leiomyoma of uterus: Secondary | ICD-10-CM | POA: Diagnosis not present

## 2021-06-05 DIAGNOSIS — N938 Other specified abnormal uterine and vaginal bleeding: Secondary | ICD-10-CM | POA: Diagnosis not present

## 2021-06-26 DIAGNOSIS — L219 Seborrheic dermatitis, unspecified: Secondary | ICD-10-CM | POA: Diagnosis not present

## 2021-06-26 DIAGNOSIS — L700 Acne vulgaris: Secondary | ICD-10-CM | POA: Diagnosis not present

## 2021-06-26 DIAGNOSIS — L810 Postinflammatory hyperpigmentation: Secondary | ICD-10-CM | POA: Diagnosis not present

## 2021-06-26 DIAGNOSIS — L680 Hirsutism: Secondary | ICD-10-CM | POA: Diagnosis not present

## 2021-12-13 DIAGNOSIS — R635 Abnormal weight gain: Secondary | ICD-10-CM | POA: Diagnosis not present

## 2021-12-13 DIAGNOSIS — E0789 Other specified disorders of thyroid: Secondary | ICD-10-CM | POA: Diagnosis not present

## 2022-01-26 IMAGING — DX DG LUMBAR SPINE COMPLETE 4+V
5 series · 5 of 5 positions shown · non-contrast
Comparison: None.

CLINICAL DATA: Chronic low back pain

EXAM:
LUMBAR SPINE - COMPLETE 4+ VIEW

[l-spine ap]
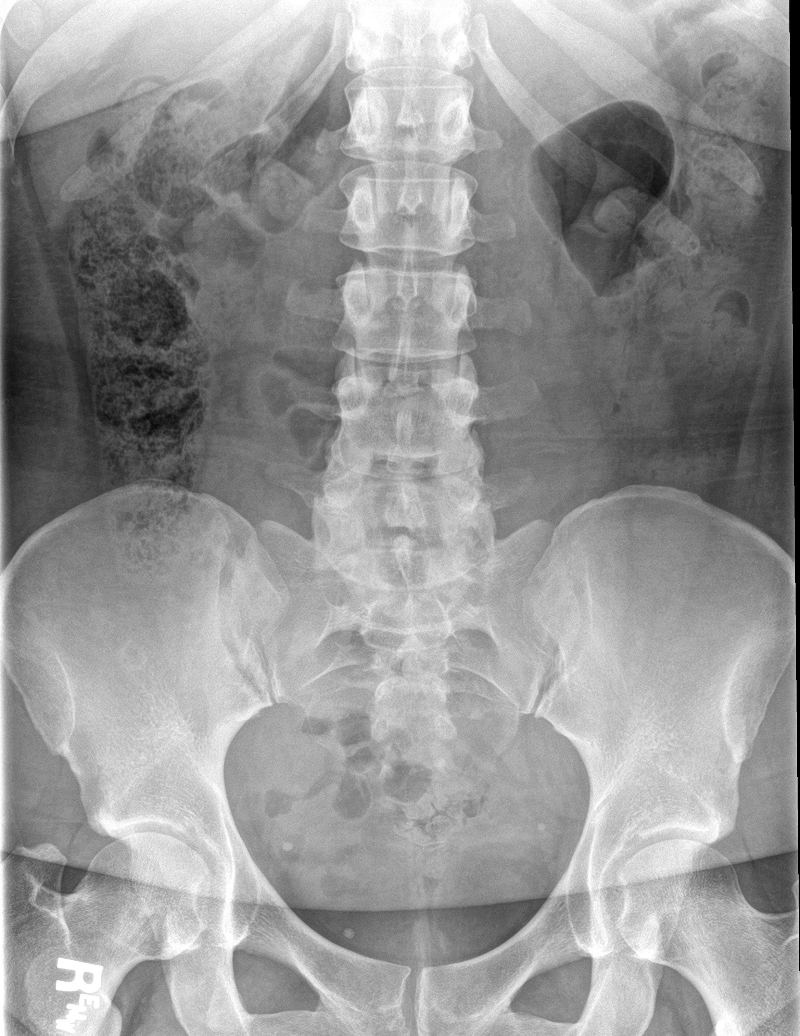

[l-spine obl (1 of 2)]
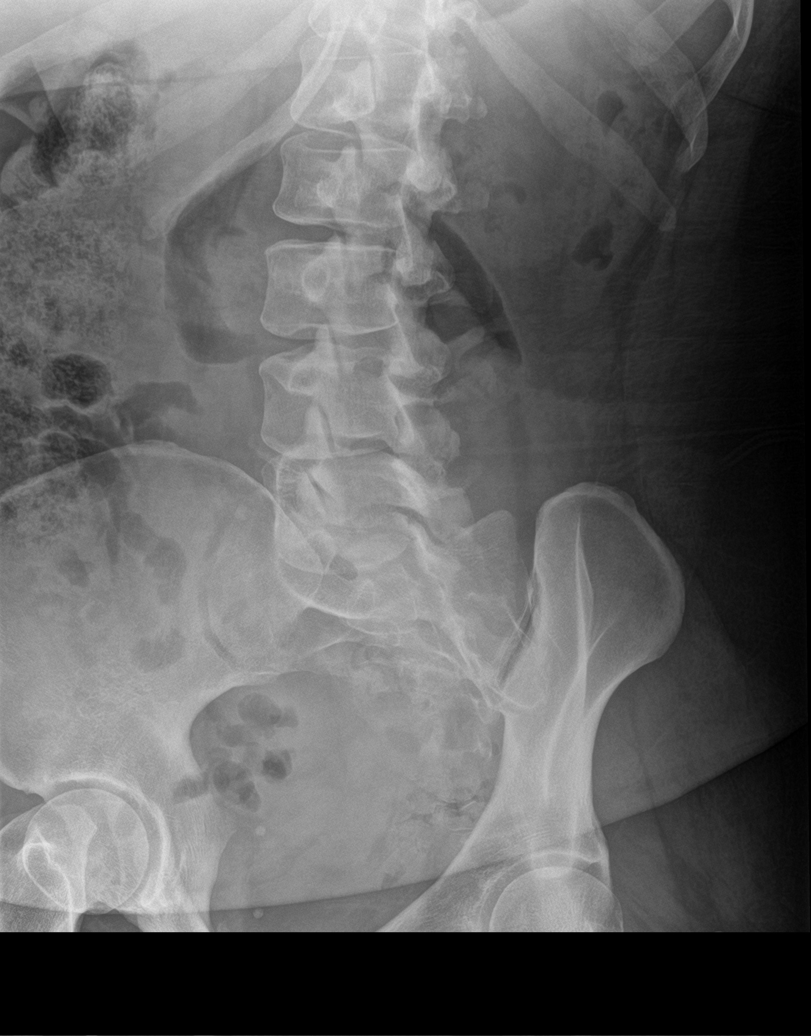

[l-spine obl (2 of 2)]
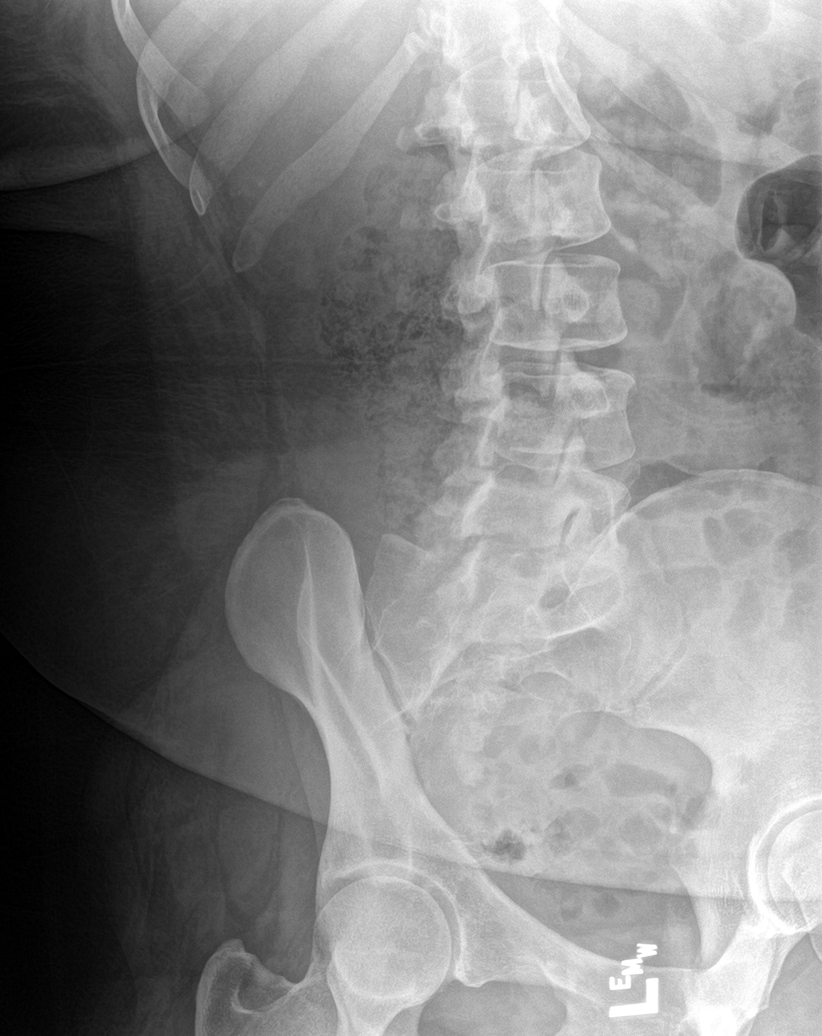

[l-spine lat]
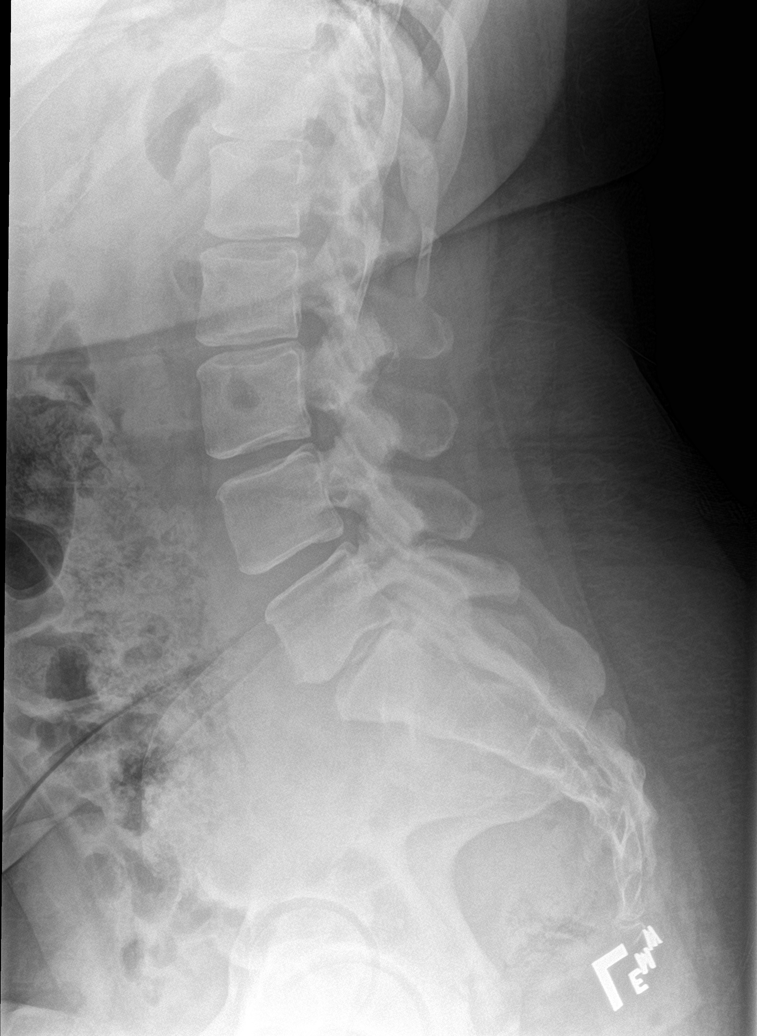

[l-spine spot]
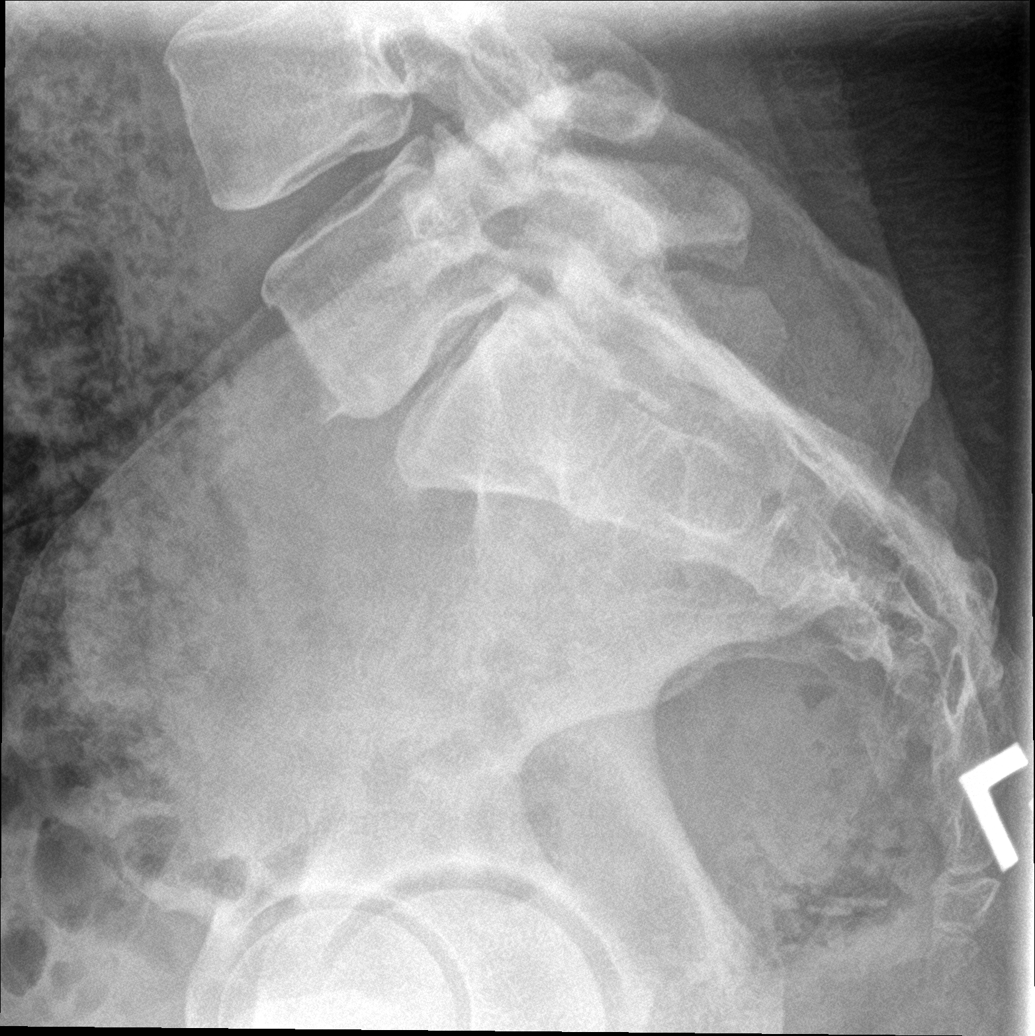

[5 of 5 positions shown; findings below may reference images not displayed]

FINDINGS: Five lumbar type vertebral segments. Vertebral body heights and
alignment are maintained. No fracture identified. Mild disc height
loss of L5-S1. The remaining intervertebral disc spaces are
relatively preserved. Minimal degenerative endplate spurring. Mild
lower lumbar facet arthrosis.
IMPRESSION: Mild lower lumbar spondylosis.

## 2022-04-16 ENCOUNTER — Ambulatory Visit (INDEPENDENT_AMBULATORY_CARE_PROVIDER_SITE_OTHER): Payer: BC Managed Care – PPO | Admitting: Medical-Surgical

## 2022-04-16 ENCOUNTER — Encounter: Payer: Self-pay | Admitting: Medical-Surgical

## 2022-04-16 VITALS — BP 104/69 | HR 78 | Resp 20 | Ht 67.5 in | Wt 250.8 lb

## 2022-04-16 DIAGNOSIS — E282 Polycystic ovarian syndrome: Secondary | ICD-10-CM

## 2022-04-16 DIAGNOSIS — Z13220 Encounter for screening for lipoid disorders: Secondary | ICD-10-CM | POA: Diagnosis not present

## 2022-04-16 DIAGNOSIS — E049 Nontoxic goiter, unspecified: Secondary | ICD-10-CM

## 2022-04-16 DIAGNOSIS — E559 Vitamin D deficiency, unspecified: Secondary | ICD-10-CM | POA: Diagnosis not present

## 2022-04-16 DIAGNOSIS — Z0000 Encounter for general adult medical examination without abnormal findings: Secondary | ICD-10-CM

## 2022-04-16 DIAGNOSIS — R5383 Other fatigue: Secondary | ICD-10-CM

## 2022-04-16 MED ORDER — TIRZEPATIDE 2.5 MG/0.5ML ~~LOC~~ SOAJ: 2.5000 mg | SUBCUTANEOUS | 0 refills | Status: DC

## 2022-04-16 NOTE — Progress Notes (Signed)
Complete physical exam  Patient: Anita Thompson   DOB: [birth year]   42 y.o. Female  MRN: [id number]  Subjective:    Chief Complaint  Patient presents with   Annual Exam   Anita Thompson is a 42 y.o. female who presents today for a complete physical exam. She reports consuming a general diet.  Going to the gym regularly.  She generally feels fairly well. She reports sleeping well. She does not have additional problems to discuss today.    Most recent fall risk assessment:    04/16/2022   10:30 AM  Fall Risk   Falls in the past year? 0  Number falls in past yr: 0  Injury with Fall? 0  Risk for fall due to : No Fall Risks  Follow up Falls evaluation completed     Most recent depression screenings:    04/16/2022   10:30 AM 05/17/2021    9:20 AM  PHQ 2/9 Scores  PHQ - 2 Score 0 0    Vision:Not within last year , Dental: No current dental problems and No regular dental care , and STD: The patient reports a past history of: HPV    Patient Care Team: Samuel Bouche, NP as PCP - General (Nurse Practitioner)   Outpatient Medications Prior to Visit  Medication Sig   cholecalciferol (VITAMIN D3) 25 MCG (1000 UNIT) tablet Take 1,000 Units by mouth daily.   tretinoin (RETIN-A) 0.1 % cream Apply topically at bedtime.   No facility-administered medications prior to visit.    Review of Systems  Constitutional:  Positive for malaise/fatigue. Negative for chills, fever and weight loss.  HENT:  Negative for congestion, ear pain, hearing loss, sinus pain and sore throat.   Eyes:  Negative for blurred vision, photophobia and pain.  Respiratory:  Negative for cough, shortness of breath and wheezing.   Cardiovascular:  Negative for chest pain, palpitations and leg swelling.  Gastrointestinal:  Negative for abdominal pain, constipation, diarrhea, heartburn, nausea and vomiting.  Genitourinary:  Negative for dysuria, frequency and urgency.  Musculoskeletal:  Positive for myalgias.  Negative for falls and neck pain.  Skin:  Negative for itching and rash.  Neurological:  Negative for dizziness, weakness and headaches.  Endo/Heme/Allergies:  Negative for polydipsia. Does not bruise/bleed easily.  Psychiatric/Behavioral:  Negative for depression, substance abuse and suicidal ideas. The patient is not nervous/anxious and does not have insomnia.      Objective:    BP 104/69 (BP Location: Left Arm, Cuff Size: Large)   Pulse 78   Resp 20   Ht 5' 7.5" (1.715 m)   Wt 250 lb 12.8 oz (113.8 kg)   SpO2 100%   BMI 38.70 kg/m    Physical Exam   No results found for any visits on 04/16/22.     Assessment & Plan:    Routine Health Maintenance and Physical Exam  Immunization History  Administered Date(s) Administered   Moderna Sars-Covid-2 Vaccination 10/27/2019, 11/24/2019   Tdap 02/04/2016    Health Maintenance  Topic Date Due   COVID-19 Vaccine (3 - 2023-24 season) 05/02/2022 (Originally 10/18/2021)   INFLUENZA VACCINE  05/18/2022 (Originally 09/17/2021)   PAP SMEAR-Modifier  05/16/2023   DTaP/Tdap/Td (2 - Td or Tdap) 02/03/2026   Hepatitis C Screening  Completed   HIV Screening  Completed   HPV VACCINES  Aged Out    Discussed health benefits of physical activity, and encouraged her to engage in regular exercise appropriate for her age and condition.  1. Annual physical exam Checking labs as below. UTD on preventative care. Wellness information provided with AVS. - Lipid panel - COMPLETE METABOLIC PANEL WITH GFR - CBC with Differential/Platelet  2. Lipid screening Checking lipids.  - Lipid panel  3. Vitamin D deficiency Checking Vitamin D - VITAMIN D 25 Hydroxy (Vit-D Deficiency, Fractures)  4. Thyroid enlarged Checking TSH.  - TSH  5. PCOS (polycystic ovarian syndrome) Checking A1c. Referring to OB/GYN per patient request.  - Hemoglobin A1c - Ambulatory referral to Obstetrics / Gynecology  6. Fatigue, unspecified type Checking iron panel.  -  Iron, TIBC and Ferritin Panel   Return in about 1 year (around 04/17/2023) for annual physical exam or sooner if needed.   Samuel Bouche, NP

## 2022-04-17 LAB — HEMOGLOBIN A1C
Hgb A1c MFr Bld: 5.6 % of total Hgb (ref <5.7)
Mean Plasma Glucose: 114 mg/dL
eAG (mmol/L): 6.3 mmol/L

## 2022-04-17 LAB — CBC WITH DIFFERENTIAL/PLATELET
Absolute Monocytes: 429 cells/uL (ref 200–950)
Basophils Absolute: 32 cells/uL (ref 0–200)
Basophils Relative: 0.6 %
Eosinophils Absolute: 90 cells/uL (ref 15–500)
Eosinophils Relative: 1.7 %
HCT: 40.5 % (ref 35.0–45.0)
Hemoglobin: 12.9 g/dL (ref 11.7–15.5)
Lymphs Abs: 1744 cells/uL (ref 850–3900)
MCH: 26.4 pg — ABNORMAL LOW (ref 27.0–33.0)
MCHC: 31.9 g/dL — ABNORMAL LOW (ref 32.0–36.0)
MCV: 83 fL (ref 80.0–100.0)
MPV: 11.2 fL (ref 7.5–12.5)
Monocytes Relative: 8.1 %
Neutro Abs: 3005 cells/uL (ref 1500–7800)
Neutrophils Relative %: 56.7 %
Platelets: 205 10*3/uL (ref 140–400)
RBC: 4.88 10*6/uL (ref 3.80–5.10)
RDW: 14.4 % (ref 11.0–15.0)
Total Lymphocyte: 32.9 %
WBC: 5.3 10*3/uL (ref 3.8–10.8)

## 2022-04-17 LAB — COMPLETE METABOLIC PANEL WITH GFR
AG Ratio: 1.4 (calc) (ref 1.0–2.5)
ALT: 12 U/L (ref 6–29)
AST: 17 U/L (ref 10–30)
Albumin: 4.5 g/dL (ref 3.6–5.1)
Alkaline phosphatase (APISO): 46 U/L (ref 31–125)
BUN: 9 mg/dL (ref 7–25)
CO2: 24 mmol/L (ref 20–32)
Calcium: 10 mg/dL (ref 8.6–10.2)
Chloride: 108 mmol/L (ref 98–110)
Creat: 0.71 mg/dL (ref 0.50–0.99)
Globulin: 3.3 g/dL (calc) (ref 1.9–3.7)
Glucose, Bld: 81 mg/dL (ref 65–99)
Potassium: 4.6 mmol/L (ref 3.5–5.3)
Sodium: 142 mmol/L (ref 135–146)
Total Bilirubin: 0.7 mg/dL (ref 0.2–1.2)
Total Protein: 7.8 g/dL (ref 6.1–8.1)
eGFR: 108 mL/min/{1.73_m2} (ref >=60)

## 2022-04-17 LAB — LIPID PANEL
Cholesterol: 152 mg/dL (ref <200)
HDL: 51 mg/dL (ref >=50)
LDL Cholesterol (Calc): 85 mg/dL (calc)
Non-HDL Cholesterol (Calc): 101 mg/dL (calc) (ref <130)
Total CHOL/HDL Ratio: 3 (calc) (ref <5.0)
Triglycerides: 70 mg/dL (ref <150)

## 2022-04-17 LAB — VITAMIN D 25 HYDROXY (VIT D DEFICIENCY, FRACTURES): Vit D, 25-Hydroxy: 38 ng/mL (ref 30–100)

## 2022-04-17 LAB — IRON,TIBC AND FERRITIN PANEL
%SAT: 30 % (calc) (ref 16–45)
Ferritin: 14 ng/mL — ABNORMAL LOW (ref 16–232)
Iron: 124 ug/dL (ref 40–190)
TIBC: 419 mcg/dL (calc) (ref 250–450)

## 2022-04-17 LAB — TSH: TSH: 1.01 mIU/L

## 2022-04-25 ENCOUNTER — Telehealth: Payer: Self-pay

## 2022-04-25 ENCOUNTER — Encounter: Payer: Self-pay | Admitting: Medical-Surgical

## 2022-04-25 NOTE — Telephone Encounter (Signed)
Initiated Prior authorization [id number] 2.'5MG'$ /0.5ML pen-injectors Via: Covermymeds Case/Key:[id number]  Status: approved as of 04/25/22 Reason:Dates not given Notified Pt via: Mychart

## 2022-05-21 ENCOUNTER — Encounter: Payer: Self-pay | Admitting: Medical-Surgical

## 2022-05-21 MED ORDER — TIRZEPATIDE 5 MG/0.5ML ~~LOC~~ SOAJ: 5.0000 mg | SUBCUTANEOUS | 0 refills | Status: DC

## 2022-05-29 DIAGNOSIS — M25561 Pain in right knee: Secondary | ICD-10-CM | POA: Diagnosis not present

## 2022-06-15 ENCOUNTER — Other Ambulatory Visit: Payer: Self-pay | Admitting: Medical-Surgical

## 2022-06-17 DIAGNOSIS — Z7409 Other reduced mobility: Secondary | ICD-10-CM | POA: Diagnosis not present

## 2022-06-17 DIAGNOSIS — M25561 Pain in right knee: Secondary | ICD-10-CM | POA: Diagnosis not present

## 2022-06-20 MED ORDER — TIRZEPATIDE 7.5 MG/0.5ML ~~LOC~~ SOAJ: 7.5000 mg | SUBCUTANEOUS | 0 refills | Status: DC

## 2022-06-20 NOTE — Addendum Note (Signed)
Addended byChristen Butter on: 06/20/2022 12:42 PM   Modules accepted: Orders

## 2022-07-02 DIAGNOSIS — L700 Acne vulgaris: Secondary | ICD-10-CM | POA: Diagnosis not present

## 2022-07-02 DIAGNOSIS — L438 Other lichen planus: Secondary | ICD-10-CM | POA: Diagnosis not present

## 2022-07-24 ENCOUNTER — Encounter: Payer: Self-pay | Admitting: Medical-Surgical

## 2022-07-24 ENCOUNTER — Ambulatory Visit: Payer: BC Managed Care – PPO | Admitting: Medical-Surgical

## 2022-07-24 VITALS — BP 94/61 | HR 83 | Resp 20 | Ht 67.5 in | Wt 231.5 lb

## 2022-07-24 DIAGNOSIS — M5412 Radiculopathy, cervical region: Secondary | ICD-10-CM | POA: Diagnosis not present

## 2022-07-24 MED ORDER — MELOXICAM 15 MG PO TABS
15.0000 mg | ORAL_TABLET | Freq: Every day | ORAL | 0 refills | Status: DC
Start: 2022-07-24 — End: 2022-11-21

## 2022-07-24 NOTE — Progress Notes (Signed)
        Established patient visit  History, exam, impression, and plan:  1. Cervical radiculopathy Pleasant 42 year old female presenting today with reports of right neck and shoulder pain over the last 3 weeks.  Originally had difficulty sleeping 1 night and woke with some right neck pain.  This lasted for couple of days before starting to improve.  Unfortunately her symptoms have periodically worsened followed by mild improvement several times over the last few weeks.  She has tried taking ibuprofen 800 mg a couple of times which helped but she does not like to take the medication regularly.  Has tried doing some stretches to her neck at home without much benefit.  Now notes that she has some numbness/tingling going down from her shoulder into the right arm.  Has noticed some subjective weakness of the right arm.  Continues to have pain along the right side of her neck and is now tender all the way along the top of the trapezius at the shoulder.  On exam, bilateral upper extremities neurovascularly intact with brisk cap refill.  Tenderness to palpation of the right cervical paraspinal muscles and to the trapezius going down to the right shoulder.  Pain reproducible with extension of the neck and leftward rotation of the head.  Muscle strength 5/5 at shoulders and elbows.  Grips strong and equal bilaterally.  Symptoms consistent with cervical radiculopathy.  Offered cervical spine x-rays today however she would like to wait for now.  Discussed treatment with a steroid burst, declined today.  Would like to start small and workup if needed.  Adding meloxicam 15 mg daily for the next 2 weeks then daily as needed.  Cervical spondylosis exercises printed and provided with patient for home completion.  If no improvement in the next couple of weeks, she will reach out and let me know so that I can order the x-ray and a steroid burst. - meloxicam (MOBIC) 15 MG tablet; Take 1 tablet (15 mg total) by mouth daily.   Dispense: 30 tablet; Refill: 0   Procedures performed this visit: None.  Return if symptoms worsen or fail to improve.  __________________________________ Thayer Ohm, DNP, APRN, FNP-BC Primary Care and Sports Medicine [facility] [place]

## 2022-07-28 ENCOUNTER — Ambulatory Visit: Payer: BC Managed Care – PPO | Admitting: Medical-Surgical

## 2022-07-30 DIAGNOSIS — Z124 Encounter for screening for malignant neoplasm of cervix: Secondary | ICD-10-CM | POA: Diagnosis not present

## 2022-07-30 DIAGNOSIS — Z01419 Encounter for gynecological examination (general) (routine) without abnormal findings: Secondary | ICD-10-CM | POA: Diagnosis not present

## 2022-07-30 DIAGNOSIS — Z1151 Encounter for screening for human papillomavirus (HPV): Secondary | ICD-10-CM | POA: Diagnosis not present

## 2022-07-30 DIAGNOSIS — Z1231 Encounter for screening mammogram for malignant neoplasm of breast: Secondary | ICD-10-CM | POA: Diagnosis not present

## 2022-07-30 DIAGNOSIS — Z6835 Body mass index (BMI) 35.0-35.9, adult: Secondary | ICD-10-CM | POA: Diagnosis not present

## 2022-08-13 ENCOUNTER — Other Ambulatory Visit: Payer: Self-pay | Admitting: Medical-Surgical

## 2022-09-07 ENCOUNTER — Other Ambulatory Visit: Payer: Self-pay | Admitting: Medical-Surgical

## 2022-10-12 ENCOUNTER — Other Ambulatory Visit: Payer: Self-pay | Admitting: Medical-Surgical

## 2022-11-13 ENCOUNTER — Encounter: Payer: Self-pay | Admitting: Medical-Surgical

## 2022-11-13 NOTE — Telephone Encounter (Signed)
Patient informed. 

## 2022-11-21 ENCOUNTER — Ambulatory Visit: Payer: BC Managed Care – PPO | Admitting: Medical-Surgical

## 2022-11-21 ENCOUNTER — Encounter: Payer: Self-pay | Admitting: Medical-Surgical

## 2022-11-21 VITALS — BP 113/75 | HR 72 | Resp 20 | Ht 67.5 in | Wt 234.9 lb

## 2022-11-21 DIAGNOSIS — Z7689 Persons encountering health services in other specified circumstances: Secondary | ICD-10-CM | POA: Diagnosis not present

## 2022-11-21 DIAGNOSIS — E282 Polycystic ovarian syndrome: Secondary | ICD-10-CM | POA: Diagnosis not present

## 2022-11-21 DIAGNOSIS — Z0000 Encounter for general adult medical examination without abnormal findings: Secondary | ICD-10-CM | POA: Insufficient documentation

## 2022-11-21 MED ORDER — TIRZEPATIDE 2.5 MG/0.5ML ~~LOC~~ SOAJ
2.5000 mg | SUBCUTANEOUS | 1 refills | Status: DC
Start: 2022-11-21 — End: 2023-01-08

## 2022-11-21 MED ORDER — LINACLOTIDE 72 MCG PO CAPS: 72.0000 ug | ORAL_CAPSULE | Freq: Every day | ORAL | 0 refills | Status: DC

## 2022-11-21 NOTE — Progress Notes (Signed)
        Established patient visit  History, exam, impression, and plan:  1. Encounter for weight management 2. PCOS (polycystic ovarian syndrome) Pleasant 42 year old female presenting today to discuss weight management. Previously taking Mounjaro but the 7.5mg  weekly dose is not helping with weight, appetite, or food noise.  Reports she is interested in going back down in her dose because the 2.5 mg weekly dose was very helpful.  The 5 mg weekly dose was only moderately helpful.  Continues to work on making dietary changes.  Notes that she is exercising approximately 4 times weekly and doing mostly cardio right now but has a goal for switching from mostly cardio to mostly weight training.  Discussed the importance for making lifestyle changes versus temporary efforts at weight loss.  For now we will go back to [facility] 2.5 mg weekly to see how she does on this and if it is more effective than the higher doses.  Procedures performed this visit: None.  Return if symptoms worsen or fail to improve.  Plan to follow-up in January as previously scheduled.  __________________________________ Thayer Ohm, DNP, APRN, FNP-BC Primary Care and Sports Medicine [facility] [place]

## 2022-12-23 ENCOUNTER — Encounter: Payer: Self-pay | Admitting: Medical-Surgical

## 2022-12-23 DIAGNOSIS — E282 Polycystic ovarian syndrome: Secondary | ICD-10-CM

## 2022-12-23 DIAGNOSIS — Z7689 Persons encountering health services in other specified circumstances: Secondary | ICD-10-CM

## 2022-12-25 ENCOUNTER — Telehealth: Payer: Self-pay | Admitting: Medical-Surgical

## 2022-12-25 NOTE — Telephone Encounter (Signed)
Patient requests update/needs prior authorization for ;  tirzepatide [facility]) 2.5 MG/0.5ML Pen [[id number]]

## 2023-01-08 ENCOUNTER — Telehealth: Payer: Self-pay

## 2023-01-08 ENCOUNTER — Other Ambulatory Visit: Payer: Self-pay | Admitting: Medical-Surgical

## 2023-01-08 MED ORDER — TIRZEPATIDE 5 MG/0.5ML ~~LOC~~ SOAJ: 5.0000 mg | SUBCUTANEOUS | 1 refills | Status: DC

## 2023-01-08 NOTE — Telephone Encounter (Addendum)
Initiated Prior authorization [id number] 2.5MG /0.5ML auto-injectors Via: Covermymeds Case/Key:[id number] Status: n/a  as of 01/08/23 Reason:Duplicate Request.Duplicate Request.Please note that an authorization is already on file for the requested medication and is effective through (04/25/2023). Pharmacy filled the 2.5mg  dose on 11/24/22 and test claim ran this morning for the next dose of 5mg  was successful. Please note: The allowed amount is 2 mL (4 pens) per 180 days. This medication is to be increased in strength every 30 days, as tolerated by the member, to a maintenance dose of up to 15mg /0.46mL (4 pens per 28 days). The program limit of 4 pens per 180 days of the 2.5mg /0.44mL strength allows for this dose increase. Please contact pharmacy to process. If you would like another copy of the approval letter, please call [phone] option 3, 3. Thank you. Notified Pt via: Mychart

## 2023-03-20 ENCOUNTER — Encounter: Payer: Self-pay | Admitting: Medical-Surgical

## 2023-03-20 MED ORDER — TIRZEPATIDE 7.5 MG/0.5ML ~~LOC~~ SOAJ: 7.5000 mg | SUBCUTANEOUS | 0 refills | Status: DC

## 2023-04-20 ENCOUNTER — Encounter: Payer: Self-pay | Admitting: Medical-Surgical

## 2023-04-20 ENCOUNTER — Ambulatory Visit (INDEPENDENT_AMBULATORY_CARE_PROVIDER_SITE_OTHER): Payer: BC Managed Care – PPO | Admitting: Medical-Surgical

## 2023-04-20 VITALS — BP 95/57 | HR 76 | Resp 20 | Ht 67.5 in | Wt 209.9 lb

## 2023-04-20 DIAGNOSIS — E559 Vitamin D deficiency, unspecified: Secondary | ICD-10-CM

## 2023-04-20 DIAGNOSIS — E049 Nontoxic goiter, unspecified: Secondary | ICD-10-CM

## 2023-04-20 DIAGNOSIS — E282 Polycystic ovarian syndrome: Secondary | ICD-10-CM | POA: Diagnosis not present

## 2023-04-20 DIAGNOSIS — Z0000 Encounter for general adult medical examination without abnormal findings: Secondary | ICD-10-CM | POA: Diagnosis not present

## 2023-04-20 MED ORDER — TIRZEPATIDE 10 MG/0.5ML ~~LOC~~ SOAJ: 10.0000 mg | SUBCUTANEOUS | 0 refills | Status: DC

## 2023-04-20 NOTE — Progress Notes (Incomplete Revision)
 Annual Wellness Visit     Patient: Anita Thompson, Female    DOB: 1981/02/08, 43 y.o.   MRN: [id number]  Subjective  Chief Complaint  Patient presents with  . Annual Exam    Anita Thompson is a 43 y.o. female who presents today for her Annual Wellness Visit. She reports consuming a low fat diet. Gym/ health club routine includes light weights and treadmill. She generally feels well. She reports sleeping well. She does not have additional problems to discuss today.     Vision:Not within last year , Dental: No current dental problems and No regular dental care , STD: The patient denies history of sexually transmitted disease.   Patient Active Problem List   Diagnosis Date Noted  . Obesity, morbid (HCC) 04/20/2023  . Annual physical exam 11/21/2022  . Ganglion of wrist 03/08/2020  . Chronic bilateral low back pain without sciatica 03/08/2020  . Thyroid enlarged 03/08/2020  . PCOS (polycystic ovarian syndrome) 03/08/2020  . Vitamin D deficiency 03/08/2020  . Palpitations 05/12/2019  . Vision abnormalities 01/12/2018  . Family history of diabetes mellitus in mother 10/13/2017  . Family history of stroke or transient ischemic attack in mother 10/13/2017  . Abnormal Pap smear of cervix 05/22/2016  . Anxiety 02/04/2016  . Gastroesophageal reflux disease without esophagitis 02/04/2016  . Chronic constipation 07/04/2013  . Acne 04/09/2011   Past Medical History:  Diagnosis Date  . Abnormal Pap smear of cervix 05/22/2016   08/2011: NILM +hrHPV, neg 16/18. Cotesting due in 1 year 12/2014: NILM neg hrHPV, cotesting due in 3 years  Due 12/2017  . Acne   . PCOS (polycystic ovarian syndrome)   . Vision abnormalities 01/12/2018   L eye essentially blind, present since childhood    Past Surgical History:  Procedure Laterality Date  . BREAST REDUCTION SURGERY  2000   Social History   Tobacco Use  . Smoking status: Never  . Smokeless tobacco: Never  Vaping Use  . Vaping  status: Never Used  Substance Use Topics  . Alcohol use: Not Currently  . Drug use: Never   Social History   Socioeconomic History  . Marital status: Unknown    Spouse name: Not on file  . Number of children: Not on file  . Years of education: Not on file  . Highest education level: Not on file  Occupational History    Employer: [employer]  Tobacco Use  . Smoking status: Never  . Smokeless tobacco: Never  Vaping Use  . Vaping status: Never Used  Substance and Sexual Activity  . Alcohol use: Not Currently  . Drug use: Never  . Sexual activity: Not Currently    Birth control/protection: None  Other Topics Concern  . Not on file  Social History Narrative  . Not on file   Social Drivers of Health   Financial Resource Strain: Not on file  Food Insecurity: Not on file  Transportation Needs: Not on file  Physical Activity: Not on file  Stress: Not on file  Social Connections: Unknown (06/23/2021)   Received [facility], [facility]   Social Network   . Social Network: Not on file  Intimate Partner Violence: Unknown (05/21/2021)   [facility], [facility]   HITS   . Physically Hurt: Not on file   . Insult or Talk Down To: Not on file   . Threaten Physical Harm: Not on file   . Scream or Curse: Not on file  Family Status  Relation Name Status  . Mother  (Not Specified)  No partnership data on file   Family History  Problem Relation Age of Onset  . High blood pressure Mother   . Diabetes Mother   . Stroke Mother   . High Cholesterol Mother    No Known Allergies    Medications: Outpatient Medications Prior to Visit  Medication Sig  . cholecalciferol (VITAMIN D3) 25 MCG (1000 UNIT) tablet Take 1,000 Units by mouth daily.  Marland Kitchen tretinoin (RETIN-A) 0.1 % cream Apply topically at bedtime.  . [DISCONTINUED] linaclotide (LINZESS) 72 MCG capsule Take 1 capsule (72 mcg total) by mouth daily before breakfast.  .  [DISCONTINUED] tirzepatide (MOUNJARO) 7.5 MG/0.5ML Pen Inject 7.5 mg into the skin once a week.   No facility-administered medications prior to visit.    No Known Allergies  Patient Care Team: Christen Butter, NP as PCP - General (Nurse Practitioner)  Review of Systems  Constitutional:  Negative for malaise/fatigue.  HENT: Negative.    Eyes: Negative.   Respiratory: Negative.    Cardiovascular: Negative.   Gastrointestinal:        Complains of intermittent gas pain that resolves with belching.  Denies reflux  Genitourinary: Negative.        States that periods happens in time each month but can be irregular and presentation and course.  Denies heavy flow or prolonged menstruation  Musculoskeletal: Negative.   Skin:        C/o dark skin blemishes  Neurological: Negative.   Endo/Heme/Allergies: Negative.   Psychiatric/Behavioral: Negative.          Objective  BP (!) 95/57   Pulse 76   Resp 20   Ht 5' 7.5" (1.715 m)   Wt 209 lb 14.4 oz (95.2 kg)   SpO2 100%   BMI 32.39 kg/m  BP Readings from Last 3 Encounters:  11/21/22 113/75  07/24/22 94/61  04/16/22 104/69   Wt Readings from Last 3 Encounters:  11/21/22 234 lb 14.4 oz (106.5 kg)  07/24/22 231 lb 8 oz (105 kg)  04/16/22 250 lb 12.8 oz (113.8 kg)      Physical Exam Constitutional:      Appearance: Normal appearance.  HENT:     Head: Normocephalic and atraumatic.     Right Ear: There is no impacted cerumen.     Left Ear: There is no impacted cerumen.     Ears:     Comments: Unable to visualize TMs because of cerumen    Nose: Nose normal.     Mouth/Throat:     Mouth: Mucous membranes are moist.     Pharynx: Oropharynx is clear.  Eyes:     Extraocular Movements: Extraocular movements intact.     Pupils: Pupils are equal, round, and reactive to light.     Comments: Left eye slightly divergent traversing through fields of vision.  Neck:     Comments: Left and right wings of thyroid palpable when  swallowing. Cardiovascular:     Rate and Rhythm: Normal rate and regular rhythm.     Pulses: Normal pulses.     Heart sounds: Normal heart sounds.  Pulmonary:     Effort: Pulmonary effort is normal.     Breath sounds: Normal breath sounds.  Abdominal:     General: Bowel sounds are normal.     Palpations: Abdomen is soft.  Musculoskeletal:        General: Normal range of motion.     Cervical back:  Normal range of motion and neck supple.  Skin:    General: Skin is warm and dry.     Capillary Refill: Capillary refill takes less than 2 seconds.     Comments: Some acne scarring and dark healed blemishes on face.  Skin on neck thickened and slightly darkened  Neurological:     General: No focal deficit present.     Mental Status: She is alert and oriented to person, place, and time. Mental status is at baseline.  Psychiatric:        Mood and Affect: Mood normal.        Behavior: Behavior normal.        Thought Content: Thought content normal.        Judgment: Judgment normal.       Most recent fall risk assessment:    07/24/2022    3:06 PM  Fall Risk   Falls in the past year? 0  Number falls in past yr: 0  Injury with Fall? 0  Risk for fall due to : No Fall Risks  Follow up Falls evaluation completed    Most recent depression screenings:    04/20/2023   10:48 AM 07/24/2022    3:06 PM  PHQ 2/9 Scores  PHQ - 2 Score 0 1    A score of 3 or more in women, and 4 or more in men indicates increased risk for alcohol abuse, EXCEPT if all of the points are from question 1   Vision/Hearing Screen: No results found.  Last CBC Lab Results  Component Value Date   WBC 5.3 04/16/2022   HGB 12.9 04/16/2022   HCT 40.5 04/16/2022   MCV 83.0 04/16/2022   MCH 26.4 (L) 04/16/2022   RDW 14.4 04/16/2022   PLT 205 04/16/2022   Last metabolic panel Lab Results  Component Value Date   GLUCOSE 81 04/16/2022   NA 142 04/16/2022   K 4.6 04/16/2022   CL 108 04/16/2022   CO2 24  04/16/2022   BUN 9 04/16/2022   CREATININE 0.71 04/16/2022   EGFR 108 04/16/2022   CALCIUM 10.0 04/16/2022   PROT 7.8 04/16/2022   BILITOT 0.7 04/16/2022   AST 17 04/16/2022   ALT 12 04/16/2022   Last lipids Lab Results  Component Value Date   CHOL 152 04/16/2022   HDL 51 04/16/2022   LDLCALC 85 04/16/2022   TRIG 70 04/16/2022   CHOLHDL 3.0 04/16/2022   Last hemoglobin A1c Lab Results  Component Value Date   HGBA1C 5.6 04/16/2022   Last thyroid functions Lab Results  Component Value Date   TSH 1.01 04/16/2022   Last vitamin D Lab Results  Component Value Date   VD25OH 38 04/16/2022      Assessment & Plan   Annual wellness visit done today including the all of the following: Reviewed patient's Family Medical History Reviewed and updated list of patient's medical providers Assessment of cognitive impairment was done Assessed patient's functional ability Established a written schedule for health screening services Health Risk Assessent Completed and Reviewed    Immunization History  Administered Date(s) Administered  . Moderna Sars-Covid-2 Vaccination 10/27/2019, 11/24/2019  . Tdap 02/04/2016    Health Maintenance  Topic Date Due  . COVID-19 Vaccine (3 - 2024-25 season) 10/19/2022  . Cervical Cancer Screening (HPV/Pap Cotest)  05/16/2023  . INFLUENZA VACCINE  05/18/2023 (Originally 09/18/2022)  . DTaP/Tdap/Td (2 - Td or Tdap) 02/03/2026  . Hepatitis C Screening  Completed  . HIV Screening  Completed  . HPV VACCINES  Aged Out     Discussed health benefits of physical activity, and encouraged her to engage in regular exercise appropriate for her age and condition.    Problem List Items Addressed This Visit       Endocrine   Thyroid enlarged   Patient not taking thyroid medication. Thyroid with visible survace effect and  and is palpable with swallowing.  Monitoring with serial TSH.   Relevant Orders   TSH   PCOS (polycystic ovarian syndrome)    Being followed by by OB/GYN.  Not taking medications for symptoms.  Patient not interested in taking medications at this time  Relevant Orders   Hemoglobin A1c   Other   Vitamin D deficiency   History of low vitamin D deficiency.  supplements with daily cholecalciferol 25 mcg 1000 unit continue cholecalciferol as directed.  Denies needing refill. relevant Orders   VITAMIN D 25 Hydroxy (Vit-D Deficiency, Fractures)   Annual physical exam - Primary   Patient describes her health as good without new complaints.  Following chronic problems relevant Orders   Lipid panel   CBC with Differential/Platelet   CMP14+EGFR   Obesity, morbid (HCC)   Taking as Mounjaro directed and has 24.1 pound weight loss since October 2024 patient goal 185 pounds.  Patient requesting to increase Mounjaro.  Patient taking Mounjaro as directed without side effects or problems increase Mounjaro to 10 mg weekly injectable.   Relevant Medications   tirzepatide (MOUNJARO) 10 MG/0.5ML Pen    Return in about 6 months (around 10/21/2023) for weight check.     Loma Sousa AGNP student patient went to the lab

## 2023-04-20 NOTE — Progress Notes (Unsigned)
 Annual Wellness Visit     Patient: Anita Thompson, Female    DOB: Jan 22, 1981, 43 y.o.   MRN: [id number]  Subjective  Chief Complaint  Patient presents with   Annual Exam    Anita Thompson is a 43 y.o. female who presents today for her Annual Wellness Visit. She reports consuming a low fat diet. Gym/ health club routine includes light weights and treadmill. She generally feels well. She reports sleeping well. She does not have additional problems to discuss today.     Vision:Not within last year , Dental: No current dental problems and No regular dental care , STD: The patient denies history of sexually transmitted disease.   Patient Active Problem List   Diagnosis Date Noted   Obesity, morbid (HCC) 04/20/2023   Annual physical exam 11/21/2022   Ganglion of wrist 03/08/2020   Chronic bilateral low back pain without sciatica 03/08/2020   Thyroid enlarged 03/08/2020   PCOS (polycystic ovarian syndrome) 03/08/2020   Vitamin D deficiency 03/08/2020   Palpitations 05/12/2019   Vision abnormalities 01/12/2018   Family history of diabetes mellitus in mother 10/13/2017   Family history of stroke or transient ischemic attack in mother 10/13/2017   Abnormal Pap smear of cervix 05/22/2016   Anxiety 02/04/2016   Gastroesophageal reflux disease without esophagitis 02/04/2016   Chronic constipation 07/04/2013   Acne 04/09/2011   Past Medical History:  Diagnosis Date   Abnormal Pap smear of cervix 05/22/2016   08/2011: NILM +hrHPV, neg 16/18. Cotesting due in 1 year 12/2014: NILM neg hrHPV, cotesting due in 3 years  Due 12/2017   Acne    PCOS (polycystic ovarian syndrome)    Vision abnormalities 01/12/2018   L eye essentially blind, present since childhood    Past Surgical History:  Procedure Laterality Date   BREAST REDUCTION SURGERY  2000   Social History   Tobacco Use   Smoking status: Never   Smokeless tobacco: Never  Vaping Use   Vaping status: Never Used   Substance Use Topics   Alcohol use: Not Currently   Drug use: Never   Social History   Socioeconomic History   Marital status: Unknown    Spouse name: Not on file   Number of children: Not on file   Years of education: Not on file   Highest education level: Not on file  Occupational History    Employer: [employer]  Tobacco Use   Smoking status: Never   Smokeless tobacco: Never  Vaping Use   Vaping status: Never Used  Substance and Sexual Activity   Alcohol use: Not Currently   Drug use: Never   Sexual activity: Not Currently    Birth control/protection: None  Other Topics Concern   Not on file  Social History Narrative   Not on file   Social Drivers of Health   Financial Resource Strain: Not on file  Food Insecurity: Not on file  Transportation Needs: Not on file  Physical Activity: Not on file  Stress: Not on file  Social Connections: Unknown (06/23/2021)   Received from [facility]   Social Network    Social Network: Not on file  Intimate Partner Violence: Unknown (05/21/2021)   [facility]   HITS    Physically Hurt: Not on file    Insult or Talk Down To: Not on file    Threaten Physical Harm: Not on file    Scream or Curse: Not on file  Family Status  Relation Name Status   Mother  (Not Specified)  No partnership data on file   Family History  Problem Relation Age of Onset   High blood pressure Mother    Diabetes Mother    Stroke Mother    High Cholesterol Mother    No Known Allergies    Medications: Outpatient Medications Prior to Visit  Medication Sig   cholecalciferol (VITAMIN D3) 25 MCG (1000 UNIT) tablet Take 1,000 Units by mouth daily.   tretinoin (RETIN-A) 0.1 % cream Apply topically at bedtime.   [DISCONTINUED] linaclotide (LINZESS) 72 MCG capsule Take 1 capsule (72 mcg total) by mouth daily before breakfast.   [DISCONTINUED] tirzepatide (MOUNJARO) 7.5 MG/0.5ML Pen  Inject 7.5 mg into the skin once a week.   No facility-administered medications prior to visit.    No Known Allergies  Patient Care Team: Christen Butter, NP as PCP - General (Nurse Practitioner)  Review of Systems  Constitutional:  Negative for malaise/fatigue.  HENT: Negative.    Eyes: Negative.   Respiratory: Negative.    Cardiovascular: Negative.   Gastrointestinal:        Complains of intermittent gas pain that resolves with belching.  Denies reflux  Genitourinary: Negative.        States that periods happens in time each month but can be irregular and presentation and course.  Denies heavy flow or prolonged menstruation  Musculoskeletal: Negative.   Skin:        C/o dark skin blemishes  Neurological: Negative.   Endo/Heme/Allergies: Negative.   Psychiatric/Behavioral: Negative.          Objective  BP (!) 95/57   Pulse 76   Resp 20   Ht 5' 7.5" (1.715 m)   Wt 209 lb 14.4 oz (95.2 kg)   SpO2 100%   BMI 32.39 kg/m  BP Readings from Last 3 Encounters:  11/21/22 113/75  07/24/22 94/61  04/16/22 104/69   Wt Readings from Last 3 Encounters:  11/21/22 234 lb 14.4 oz (106.5 kg)  07/24/22 231 lb 8 oz (105 kg)  04/16/22 250 lb 12.8 oz (113.8 kg)      Physical Exam Constitutional:      Appearance: Normal appearance.  HENT:     Head: Normocephalic and atraumatic.     Right Ear: There is no impacted cerumen.     Left Ear: There is no impacted cerumen.     Ears:     Comments: Unable to visualize TMs because of cerumen    Nose: Nose normal.     Mouth/Throat:     Mouth: Mucous membranes are moist.     Pharynx: Oropharynx is clear.  Eyes:     Extraocular Movements: Extraocular movements intact.     Pupils: Pupils are equal, round, and reactive to light.     Comments: Left eye slightly divergent traversing through fields of vision.  Neck:     Comments: Left and right wings of thyroid palpable when swallowing. Cardiovascular:     Rate and Rhythm: Normal rate and  regular rhythm.     Pulses: Normal pulses.     Heart sounds: Normal heart sounds.  Pulmonary:     Effort: Pulmonary effort is normal.     Breath sounds: Normal breath sounds.  Abdominal:     General: Bowel sounds are normal.     Palpations: Abdomen is soft.  Musculoskeletal:        General: Normal range of motion.     Cervical back:  Normal range of motion and neck supple.  Skin:    General: Skin is warm and dry.     Capillary Refill: Capillary refill takes less than 2 seconds.     Comments: Some acne scarring and dark healed blemishes on face.  Skin on neck thickened and slightly darkened  Neurological:     General: No focal deficit present.     Mental Status: She is alert and oriented to person, place, and time. Mental status is at baseline.  Psychiatric:        Mood and Affect: Mood normal.        Behavior: Behavior normal.        Thought Content: Thought content normal.        Judgment: Judgment normal.        Most recent fall risk assessment:    07/24/2022    3:06 PM  Fall Risk   Falls in the past year? 0  Number falls in past yr: 0  Injury with Fall? 0  Risk for fall due to : No Fall Risks  Follow up Falls evaluation completed    Most recent depression screenings:    04/20/2023   10:48 AM 07/24/2022    3:06 PM  PHQ 2/9 Scores  PHQ - 2 Score 0 1    A score of 3 or more in women, and 4 or more in men indicates increased risk for alcohol abuse, EXCEPT if all of the points are from question 1   Vision/Hearing Screen: No results found.  Last CBC Lab Results  Component Value Date   WBC 5.3 04/16/2022   HGB 12.9 04/16/2022   HCT 40.5 04/16/2022   MCV 83.0 04/16/2022   MCH 26.4 (L) 04/16/2022   RDW 14.4 04/16/2022   PLT 205 04/16/2022   Last metabolic panel Lab Results  Component Value Date   GLUCOSE 81 04/16/2022   NA 142 04/16/2022   K 4.6 04/16/2022   CL 108 04/16/2022   CO2 24 04/16/2022   BUN 9 04/16/2022   CREATININE 0.71 04/16/2022   EGFR 108  04/16/2022   CALCIUM 10.0 04/16/2022   PROT 7.8 04/16/2022   BILITOT 0.7 04/16/2022   AST 17 04/16/2022   ALT 12 04/16/2022   Last lipids Lab Results  Component Value Date   CHOL 152 04/16/2022   HDL 51 04/16/2022   LDLCALC 85 04/16/2022   TRIG 70 04/16/2022   CHOLHDL 3.0 04/16/2022   Last hemoglobin A1c Lab Results  Component Value Date   HGBA1C 5.6 04/16/2022   Last thyroid functions Lab Results  Component Value Date   TSH 1.01 04/16/2022   Last vitamin D Lab Results  Component Value Date   VD25OH 38 04/16/2022      Assessment & Plan   Annual wellness visit done today including the all of the following: Reviewed patient's Family Medical History Reviewed and updated list of patient's medical providers Assessment of cognitive impairment was done Assessed patient's functional ability Established a written schedule for health screening services Health Risk Assessent Completed and Reviewed    Immunization History  Administered Date(s) Administered   Moderna Sars-Covid-2 Vaccination 10/27/2019, 11/24/2019   Tdap 02/04/2016    Health Maintenance  Topic Date Due   COVID-19 Vaccine (3 - 2024-25 season) 10/19/2022   Cervical Cancer Screening (HPV/Pap Cotest)  05/16/2023   INFLUENZA VACCINE  05/18/2023 (Originally 09/18/2022)   DTaP/Tdap/Td (2 - Td or Tdap) 02/03/2026   Hepatitis C Screening  Completed   HIV  Screening  Completed   HPV VACCINES  Aged Out     Discussed health benefits of physical activity, and encouraged her to engage in regular exercise appropriate for her age and condition.    Problem List Items Addressed This Visit       Endocrine   Thyroid enlarged   Patient medication positive for palpable enlarged thyroid.  Monitoring with serial TSH.   Relevant Orders   TSH   PCOS (polycystic ovarian syndrome)   Being followed by by OB/GYN.  Not taking medications for symptoms.  Patient not interested in taking medications at this time  Relevant  Orders   Hemoglobin A1c   Other   Vitamin D deficiency   History of low vitamin D supplements with daily cholecalciferol 25 mcg 1000 unit continue cholecalciferol as directed.  Denies needing refill. relevant Orders   VITAMIN D 25 Hydroxy (Vit-D Deficiency, Fractures)   Annual physical exam - Primary   Patient describes her health as good without new complaints.  Following chronic problems relevant Orders   Lipid panel   CBC with Differential/Platelet   CMP14+EGFR   Obesity, morbid (HCC)   Taking as directed and has 24.1 pound weight loss since October 2024 patient goal 185 pounds.  Patient requesting to increase Mounjaro.  Patient taking Mounjaro as directed without side effects or problems increase Mounjaro to 10 mg weekly injectable.   Relevant Medications   tirzepatide (MOUNJARO) 10 MG/0.5ML Pen    Return in about 6 months (around 10/21/2023) for weight check.     Loma Sousa AGNP student patient went to the lab

## 2023-04-21 ENCOUNTER — Encounter: Payer: Self-pay | Admitting: Medical-Surgical

## 2023-04-21 LAB — CBC WITH DIFFERENTIAL/PLATELET
Basophils Absolute: 0 10*3/uL (ref 0.0–0.2)
Basos: 1 %
EOS (ABSOLUTE): 0.1 10*3/uL (ref 0.0–0.4)
Eos: 2 %
Hematocrit: 41.2 % (ref 34.0–46.6)
Hemoglobin: 13.3 g/dL (ref 11.1–15.9)
Immature Grans (Abs): 0 10*3/uL (ref 0.0–0.1)
Immature Granulocytes: 0 %
Lymphocytes Absolute: 1.5 10*3/uL (ref 0.7–3.1)
Lymphs: 35 %
MCH: 28.1 pg (ref 26.6–33.0)
MCHC: 32.3 g/dL (ref 31.5–35.7)
MCV: 87 fL (ref 79–97)
Monocytes Absolute: 0.4 10*3/uL (ref 0.1–0.9)
Monocytes: 10 %
Neutrophils Absolute: 2.2 10*3/uL (ref 1.4–7.0)
Neutrophils: 52 %
Platelets: 215 10*3/uL (ref 150–450)
RBC: 4.74 x10E6/uL (ref 3.77–5.28)
RDW: 14.3 % (ref 11.7–15.4)
WBC: 4.3 10*3/uL (ref 3.4–10.8)

## 2023-04-21 LAB — CMP14+EGFR
ALT: 17 IU/L (ref 0–32)
AST: 23 IU/L (ref 0–40)
Albumin: 4.4 g/dL (ref 3.9–4.9)
Alkaline Phosphatase: 47 IU/L (ref 44–121)
BUN/Creatinine Ratio: 13 (ref 9–23)
BUN: 11 mg/dL (ref 6–24)
Bilirubin Total: 0.5 mg/dL (ref 0.0–1.2)
CO2: 24 mmol/L (ref 20–29)
Calcium: 9.5 mg/dL (ref 8.7–10.2)
Chloride: 105 mmol/L (ref 96–106)
Creatinine, Ser: 0.86 mg/dL (ref 0.57–1.00)
Globulin, Total: 2.8 g/dL (ref 1.5–4.5)
Glucose: 79 mg/dL (ref 70–99)
Potassium: 4.3 mmol/L (ref 3.5–5.2)
Sodium: 140 mmol/L (ref 134–144)
Total Protein: 7.2 g/dL (ref 6.0–8.5)
eGFR: 85 mL/min/{1.73_m2} (ref >59)

## 2023-04-21 LAB — LIPID PANEL
Chol/HDL Ratio: 2.9 ratio (ref 0.0–4.4)
Cholesterol, Total: 150 mg/dL (ref 100–199)
HDL: 52 mg/dL (ref >39)
LDL Chol Calc (NIH): 89 mg/dL (ref 0–99)
Triglycerides: 37 mg/dL (ref 0–149)
VLDL Cholesterol Cal: 9 mg/dL (ref 5–40)

## 2023-04-21 LAB — TSH: TSH: 0.603 u[IU]/mL (ref 0.450–4.500)

## 2023-04-21 LAB — VITAMIN D 25 HYDROXY (VIT D DEFICIENCY, FRACTURES): Vit D, 25-Hydroxy: 56.2 ng/mL (ref 30.0–100.0)

## 2023-04-21 LAB — HEMOGLOBIN A1C
Est. average glucose Bld gHb Est-mCnc: 103 mg/dL
Hgb A1c MFr Bld: 5.2 % (ref 4.8–5.6)

## 2023-04-21 NOTE — Progress Notes (Signed)
 Medical screening examination/treatment was performed by qualified clinical staff member and as supervising provider I was immediately available for consultation/collaboration. I have reviewed documentation and agree with assessment and plan.  Thayer Ohm, DNP, APRN, FNP-BC [facility]

## 2023-06-30 ENCOUNTER — Encounter: Payer: Self-pay | Admitting: Medical-Surgical

## 2023-06-30 DIAGNOSIS — E282 Polycystic ovarian syndrome: Secondary | ICD-10-CM

## 2023-06-30 MED ORDER — TIRZEPATIDE 12.5 MG/0.5ML ~~LOC~~ SOAJ: 12.5000 mg | SUBCUTANEOUS | 0 refills | Status: DC

## 2023-06-30 NOTE — Telephone Encounter (Signed)
 Patient requesting strength increase of Mounjaro  to 12.5mg   for 2 months  Please see message from patient attached.  Last written as Mounjaro  10mg  04/20/2023 Last OV 04/20/2023 Upcoming appt 10/21/2023

## 2023-07-01 ENCOUNTER — Telehealth: Payer: Self-pay

## 2023-07-01 ENCOUNTER — Other Ambulatory Visit (HOSPITAL_COMMUNITY): Payer: Self-pay

## 2023-07-01 MED ORDER — ZEPBOUND 12.5 MG/0.5ML ~~LOC~~ SOAJ: 12.5000 mg | SUBCUTANEOUS | 0 refills | Status: DC

## 2023-07-01 NOTE — Telephone Encounter (Signed)

## 2023-07-01 NOTE — Addendum Note (Signed)
 Addended byCherre Cornish on: 07/01/2023 07:39 PM   Modules accepted: Orders

## 2023-07-02 ENCOUNTER — Other Ambulatory Visit: Payer: Self-pay | Admitting: Medical-Surgical

## 2023-07-02 DIAGNOSIS — E282 Polycystic ovarian syndrome: Secondary | ICD-10-CM

## 2023-07-03 ENCOUNTER — Other Ambulatory Visit (HOSPITAL_COMMUNITY): Payer: Self-pay

## 2023-07-03 ENCOUNTER — Telehealth: Payer: Self-pay

## 2023-07-03 NOTE — Telephone Encounter (Signed)
 Pharmacy Patient Advocate Encounter   Received notification from RX Request Messages that prior authorization for Zepbound  12.5mg /0.41ml is required/requested.   Insurance verification completed.   The patient is insured through [facility] .   Per test claim:  ANTI-OBESITY MEDICATION NOT COVERED. PRODUCT OR SERVICE NOT COVERED

## 2023-07-03 NOTE — Telephone Encounter (Signed)
 The patients insurance will not pay for anti-obesity drugs, they are a plan exclusion.

## 2023-09-11 ENCOUNTER — Encounter: Payer: Self-pay | Admitting: Medical-Surgical

## 2023-09-11 MED ORDER — TIRZEPATIDE-WEIGHT MANAGEMENT 2.5 MG/0.5ML ~~LOC~~ SOLN
2.5000 mg | SUBCUTANEOUS | 0 refills | Status: DC
Start: 2023-09-11 — End: 2023-10-13

## 2023-09-11 NOTE — Telephone Encounter (Signed)
Please see the MyChart message reply(ies) for my assessment and plan.    This patient gave consent for this Medical Advice Message and is aware that it may result in a bill to their insurance company, as well as the possibility of receiving a bill for a co-payment or deductible. They are an established patient, but are not seeking medical advice exclusively about a problem treated during an in person or video visit in the last seven days. I did not recommend an in person or video visit within seven days of my reply.    I spent a total of 5 minutes cumulative time within 7 days through MyChart messaging.  Keniesha Adderly, NP   

## 2023-10-13 ENCOUNTER — Encounter: Payer: Self-pay | Admitting: Medical-Surgical

## 2023-10-13 DIAGNOSIS — L810 Postinflammatory hyperpigmentation: Secondary | ICD-10-CM | POA: Diagnosis not present

## 2023-10-13 DIAGNOSIS — L700 Acne vulgaris: Secondary | ICD-10-CM | POA: Diagnosis not present

## 2023-10-13 DIAGNOSIS — K1379 Other lesions of oral mucosa: Secondary | ICD-10-CM | POA: Diagnosis not present

## 2023-10-13 DIAGNOSIS — D485 Neoplasm of uncertain behavior of skin: Secondary | ICD-10-CM | POA: Diagnosis not present

## 2023-10-13 DIAGNOSIS — K131 Cheek and lip biting: Secondary | ICD-10-CM | POA: Diagnosis not present

## 2023-10-13 DIAGNOSIS — L299 Pruritus, unspecified: Secondary | ICD-10-CM | POA: Diagnosis not present

## 2023-10-13 MED ORDER — TIRZEPATIDE-WEIGHT MANAGEMENT 2.5 MG/0.5ML ~~LOC~~ SOLN: 2.5000 mg | SUBCUTANEOUS | 0 refills | Status: DC

## 2023-10-21 ENCOUNTER — Encounter: Payer: Self-pay | Admitting: Medical-Surgical

## 2023-10-21 ENCOUNTER — Ambulatory Visit: Admitting: Medical-Surgical

## 2023-10-21 DIAGNOSIS — R339 Retention of urine, unspecified: Secondary | ICD-10-CM

## 2023-10-21 DIAGNOSIS — E282 Polycystic ovarian syndrome: Secondary | ICD-10-CM | POA: Diagnosis not present

## 2023-10-21 LAB — POCT URINALYSIS DIP (CLINITEK)
Bilirubin, UA: NEGATIVE
Glucose, UA: NEGATIVE mg/dL
Ketones, POC UA: NEGATIVE mg/dL
Leukocytes, UA: NEGATIVE
Nitrite, UA: NEGATIVE
POC PROTEIN,UA: NEGATIVE
Spec Grav, UA: 1.025 (ref 1.010–1.025)
Urobilinogen, UA: 0.2 U/dL
pH, UA: 6 (ref 5.0–8.0)

## 2023-10-21 MED ORDER — ZEPBOUND 5 MG/0.5ML ~~LOC~~ SOAJ: 5.0000 mg | SUBCUTANEOUS | 0 refills | Status: DC

## 2023-10-21 NOTE — Progress Notes (Signed)
 Established patient visit   History of Present Illness   Discussed the use of AI scribe software for clinical note transcription with the patient, who gave verbal consent to proceed.  History of Present Illness   Anita Thompson is a 43 year old female who presents for a follow-up on weight management and urinary symptoms.  Weight management - Weight has fluctuated since March, reaching a low of 197-198 pounds before pausing Mounjaro  due to insurance issues - Resumed weight management with Zepbound  2.5mg  weekly on August 1st - Weight was approximately 209 pounds when medication restarted - Exercises four to five times per week, including strength training and cardio - Works from home, which affects overall physical activity levels and eating habits  Lower urinary tract symptoms - Increased urinary frequency - Sensation of incomplete bladder emptying - No dysuria, urgency, or urinary odor - Increased fluid intake - Recently started taking creatine  Sleep disturbance - Wakes up early and is unable to return to sleep - Attributes sleep disturbance to possible perimenopausal changes - No night sweats or hot flashes - Previously tried melatonin but discontinued due to grogginess      Physical Exam   Physical Exam Vitals reviewed.  Constitutional:      General: She is not in acute distress.    Appearance: Normal appearance.  HENT:     Head: Normocephalic and atraumatic.  Cardiovascular:     Rate and Rhythm: Normal rate and regular rhythm.     Pulses: Normal pulses.     Heart sounds: Normal heart sounds. No murmur heard.    No friction rub. No gallop.  Pulmonary:     Effort: Pulmonary effort is normal. No respiratory distress.     Breath sounds: Normal breath sounds. No wheezing.  Skin:    General: Skin is warm and dry.  Neurological:     Mental Status: She is alert and oriented to person, place, and time.  Psychiatric:        Mood and Affect: Mood normal.         Behavior: Behavior normal.        Thought Content: Thought content normal.        Judgment: Judgment normal.    Assessment & Plan   Assessment and Plan Morbid obesity due to excess calories Weight management with Zepbound  2.5 mg since August 1st. Weight reduced by a few pounds. No significant side effects. Engaging in regular physical activity. Aware of dietary challenges while working from home. - Continue Zepbound  2.5 mg for one month, then increase to 5 mg for three months. - Engage in regular physical activity, including strength training and cardio, 4-5 times a week. - Monitor weight and dietary habits. - Follow up in six months or sooner if needed.  Bladder dysfunction with incomplete emptying Increased urinary frequency and sensation of incomplete bladder emptying. No burning, urgency, or urinary odor. Possible causes include UTI, pelvic floor dysfunction, or overactive bladder. - Obtain urine sample to check for UTI. - Recommend double voiding technique.  Perimenopausal symptoms with sleep disturbance Experiencing sleep disturbances, waking early and unable to return to sleep. No night sweats or hot flashes. Discussed herbal and medicinal sleep aids. - Consider herbal options for sleep aid, such as ashwagandha, valerian root, and kava. - Try sleepy time tea or chamomile tea as a non-medicinal option.      Follow up   Return in about 6 months (around 04/19/2024) for annual physical exam or  sooner if needed. __________________________________ Zada FREDRIK Palin, DNP, APRN, FNP-BC Primary Care and Sports Medicine [facility]

## 2023-10-23 ENCOUNTER — Ambulatory Visit: Payer: Self-pay | Admitting: Medical-Surgical

## 2023-10-23 DIAGNOSIS — R3121 Asymptomatic microscopic hematuria: Secondary | ICD-10-CM

## 2023-10-23 LAB — URINE CULTURE

## 2023-11-02 DIAGNOSIS — Z1231 Encounter for screening mammogram for malignant neoplasm of breast: Secondary | ICD-10-CM | POA: Diagnosis not present

## 2023-11-02 DIAGNOSIS — Z1331 Encounter for screening for depression: Secondary | ICD-10-CM | POA: Diagnosis not present

## 2023-11-02 DIAGNOSIS — R3915 Urgency of urination: Secondary | ICD-10-CM | POA: Diagnosis not present

## 2023-11-02 DIAGNOSIS — Z124 Encounter for screening for malignant neoplasm of cervix: Secondary | ICD-10-CM | POA: Diagnosis not present

## 2023-11-02 DIAGNOSIS — R399 Unspecified symptoms and signs involving the genitourinary system: Secondary | ICD-10-CM | POA: Diagnosis not present

## 2023-11-02 DIAGNOSIS — Z6831 Body mass index (BMI) 31.0-31.9, adult: Secondary | ICD-10-CM | POA: Diagnosis not present

## 2023-11-02 DIAGNOSIS — Z01411 Encounter for gynecological examination (general) (routine) with abnormal findings: Secondary | ICD-10-CM | POA: Diagnosis not present

## 2023-11-02 DIAGNOSIS — N938 Other specified abnormal uterine and vaginal bleeding: Secondary | ICD-10-CM | POA: Diagnosis not present

## 2023-12-07 DIAGNOSIS — R3915 Urgency of urination: Secondary | ICD-10-CM | POA: Diagnosis not present

## 2023-12-07 DIAGNOSIS — N840 Polyp of corpus uteri: Secondary | ICD-10-CM | POA: Diagnosis not present

## 2023-12-07 DIAGNOSIS — N925 Other specified irregular menstruation: Secondary | ICD-10-CM | POA: Diagnosis not present

## 2023-12-07 DIAGNOSIS — R3129 Other microscopic hematuria: Secondary | ICD-10-CM | POA: Diagnosis not present

## 2023-12-07 DIAGNOSIS — D251 Intramural leiomyoma of uterus: Secondary | ICD-10-CM | POA: Diagnosis not present

## 2023-12-07 DIAGNOSIS — N938 Other specified abnormal uterine and vaginal bleeding: Secondary | ICD-10-CM | POA: Diagnosis not present

## 2023-12-31 ENCOUNTER — Encounter: Payer: Self-pay | Admitting: Medical-Surgical

## 2023-12-31 ENCOUNTER — Telehealth: Payer: Self-pay

## 2023-12-31 NOTE — Telephone Encounter (Signed)
 I called and left a message for a return call. We do not have colon cancer screening, or recent mammogram or PAP.   I did send for mammogram and PAP from Dr Jadine.

## 2024-02-05 DIAGNOSIS — Z3202 Encounter for pregnancy test, result negative: Secondary | ICD-10-CM | POA: Diagnosis not present

## 2024-02-05 DIAGNOSIS — N939 Abnormal uterine and vaginal bleeding, unspecified: Secondary | ICD-10-CM | POA: Diagnosis not present

## 2024-02-05 DIAGNOSIS — N938 Other specified abnormal uterine and vaginal bleeding: Secondary | ICD-10-CM | POA: Diagnosis not present

## 2024-02-26 ENCOUNTER — Encounter: Payer: Self-pay | Admitting: Medical-Surgical

## 2024-02-26 MED ORDER — ZEPBOUND 7.5 MG/0.5ML ~~LOC~~ SOAJ: 7.5000 mg | SUBCUTANEOUS | 1 refills | Status: AC

## 2024-03-16 ENCOUNTER — Telehealth: Payer: Self-pay

## 2024-03-16 ENCOUNTER — Other Ambulatory Visit (HOSPITAL_COMMUNITY): Payer: Self-pay

## 2024-03-16 NOTE — Telephone Encounter (Signed)
 Pharmacy Patient Advocate Encounter   Received notification from [employer] that prior authorization for Zepbound  7.5MG /0.5ML pen-injectors  is required/requested.   Insurance verification completed.   The patient is insured through [facility].   Per test claim: Per test claim, medication is not covered due to plan/benefit exclusion, PA not submitted at this time
